# Patient Record
Sex: Female | Born: 1960 | Race: White | Hispanic: No | Marital: Single | Smoking: Former smoker
Health system: Southern US, Community
[De-identification: ages and names within clinical notes are randomized; demographics above are authoritative.]

## PROBLEM LIST (undated history)

## (undated) DIAGNOSIS — U071 COVID-19: Secondary | ICD-10-CM

## (undated) HISTORY — PX: DILATION AND CURETTAGE OF UTERUS: SHX78

## (undated) HISTORY — PX: LAPAROSCOPIC ENDOMETRIOSIS FULGURATION: SUR769

## (undated) HISTORY — PX: ABDOMINAL HYSTERECTOMY: SHX81

---

## 1998-04-14 ENCOUNTER — Other Ambulatory Visit: Admission: RE | Admit: 1998-04-14 | Discharge: 1998-04-14 | Payer: Self-pay | Admitting: Obstetrics and Gynecology

## 1998-04-22 ENCOUNTER — Ambulatory Visit (HOSPITAL_COMMUNITY): Admission: RE | Admit: 1998-04-22 | Discharge: 1998-04-22 | Payer: Self-pay | Admitting: Obstetrics and Gynecology

## 1999-07-10 ENCOUNTER — Other Ambulatory Visit: Admission: RE | Admit: 1999-07-10 | Discharge: 1999-07-10 | Payer: Self-pay | Admitting: Obstetrics and Gynecology

## 1999-07-28 ENCOUNTER — Ambulatory Visit (HOSPITAL_COMMUNITY): Admission: RE | Admit: 1999-07-28 | Discharge: 1999-07-28 | Payer: Self-pay | Admitting: Obstetrics and Gynecology

## 1999-08-07 ENCOUNTER — Encounter (INDEPENDENT_AMBULATORY_CARE_PROVIDER_SITE_OTHER): Payer: Self-pay | Admitting: Specialist

## 1999-08-07 ENCOUNTER — Other Ambulatory Visit: Admission: RE | Admit: 1999-08-07 | Discharge: 1999-08-07 | Payer: Self-pay | Admitting: Obstetrics and Gynecology

## 1999-10-06 ENCOUNTER — Other Ambulatory Visit: Admission: RE | Admit: 1999-10-06 | Discharge: 1999-10-06 | Payer: Self-pay | Admitting: *Deleted

## 2000-02-26 ENCOUNTER — Other Ambulatory Visit: Admission: RE | Admit: 2000-02-26 | Discharge: 2000-02-26 | Payer: Self-pay | Admitting: *Deleted

## 2008-09-15 ENCOUNTER — Encounter: Admission: RE | Admit: 2008-09-15 | Discharge: 2008-09-15 | Payer: Self-pay | Admitting: Unknown Physician Specialty

## 2009-03-21 ENCOUNTER — Ambulatory Visit: Payer: Self-pay | Admitting: Family Medicine

## 2009-03-27 ENCOUNTER — Ambulatory Visit: Payer: Self-pay | Admitting: Family Medicine

## 2009-03-27 DIAGNOSIS — Z862 Personal history of diseases of the blood and blood-forming organs and certain disorders involving the immune mechanism: Secondary | ICD-10-CM | POA: Insufficient documentation

## 2009-03-27 LAB — CONVERTED CEMR LAB
Ketones, urine, test strip: NEGATIVE
Nitrite: POSITIVE
Protein, U semiquant: 100
Urobilinogen, UA: 1
pH: 5

## 2009-03-28 ENCOUNTER — Encounter: Payer: Self-pay | Admitting: Family Medicine

## 2009-03-28 LAB — CONVERTED CEMR LAB
Basophils Absolute: 0.1 10*3/uL (ref 0.0–0.1)
Eosinophils Relative: 3 % (ref 0–5)
HCT: 28 % — ABNORMAL LOW (ref 36.0–46.0)
Lymphocytes Relative: 12 % (ref 12–46)
Lymphs Abs: 1.3 10*3/uL (ref 0.7–4.0)
Neutro Abs: 8 10*3/uL — ABNORMAL HIGH (ref 1.7–7.7)
Neutrophils Relative %: 76 % (ref 43–77)
Platelets: 442 10*3/uL — ABNORMAL HIGH (ref 150–400)
RDW: 15 % (ref 11.5–15.5)
WBC: 10.6 10*3/uL — ABNORMAL HIGH (ref 4.0–10.5)

## 2009-05-18 ENCOUNTER — Ambulatory Visit: Payer: Self-pay | Admitting: Family Medicine

## 2009-05-18 DIAGNOSIS — B358 Other dermatophytoses: Secondary | ICD-10-CM | POA: Insufficient documentation

## 2009-05-18 LAB — CONVERTED CEMR LAB
Bilirubin Urine: NEGATIVE
Glucose, Urine, Semiquant: NEGATIVE
Ketones, urine, test strip: NEGATIVE
Nitrite: NEGATIVE
Specific Gravity, Urine: 1.02
Urobilinogen, UA: 0.2
pH: 6

## 2009-06-25 IMAGING — CT CT ABDOMEN W/ CM
2 of 5 series · 16 of 46 positions shown, 18 images · IV contrast (READICAT/WATER & OMNI 300/[ID])
Comparison: None available.

CT ABDOMEN

CLINICAL DATA: Mass right lower quadrant.  History endometriosis.
Status post removal of left ovary.

CT ABDOMEN AND PELVIS WITH CONTRAST
TECHNIQUE: Multidetector CT imaging of the abdomen and pelvis was
performed using the standard protocol following bolus
administration of intravenous contrast.
Contrast: 100 ml Omnipaque 300

[Series 2: abdomen w/ · axial · 0.87mm/px · z∈[-463,-58]mm · 13 of 91 slices shown, 15 images]
[im 5/91  soft-tissue]
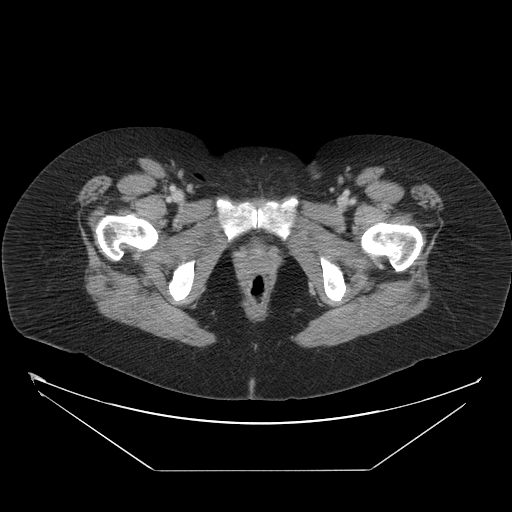
[im 5/91  bone]
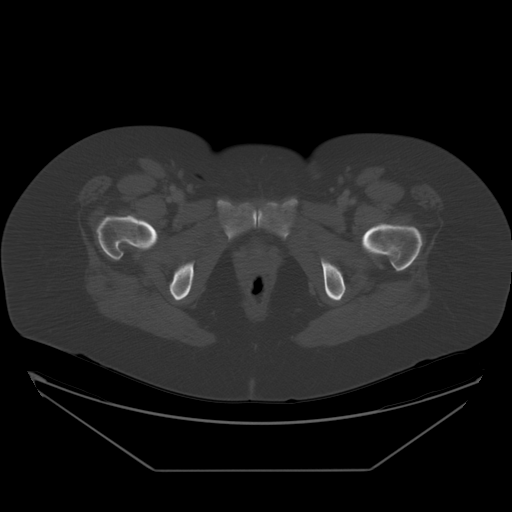
[im 15/91  soft-tissue]
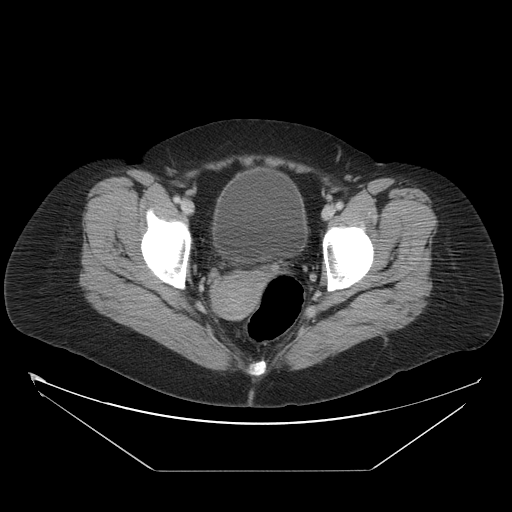
[im 19/91  soft-tissue]
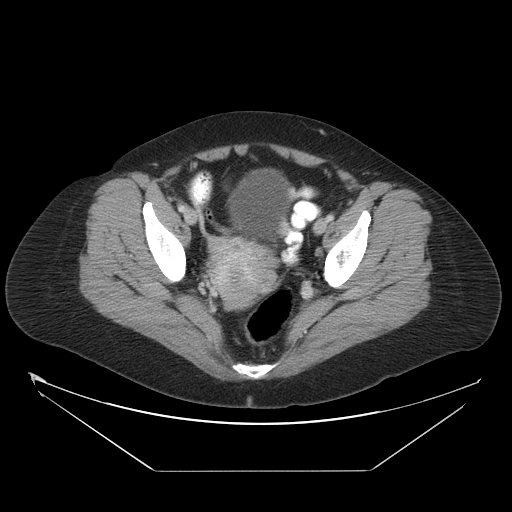
[im 24/91  soft-tissue]
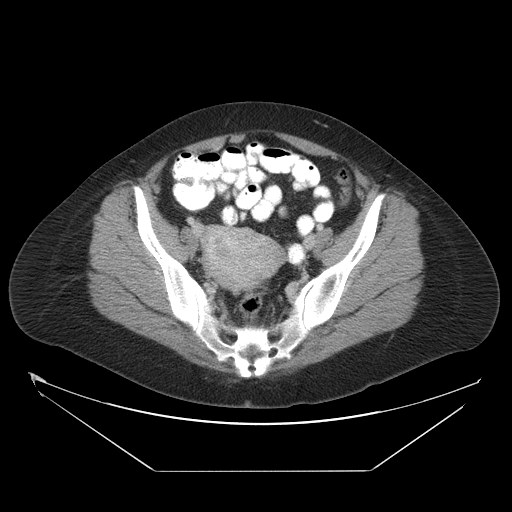
[im 34/91  soft-tissue]
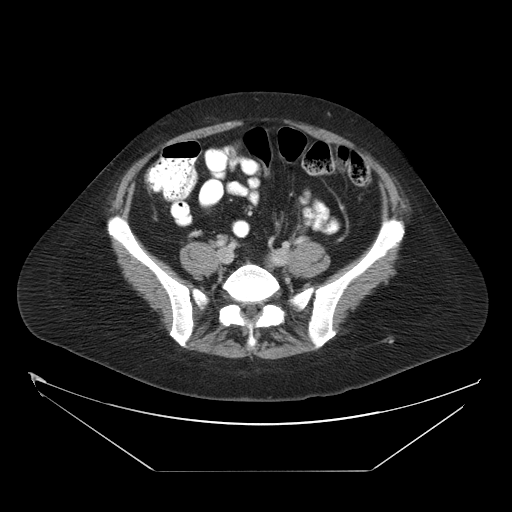
[im 38/91  soft-tissue]
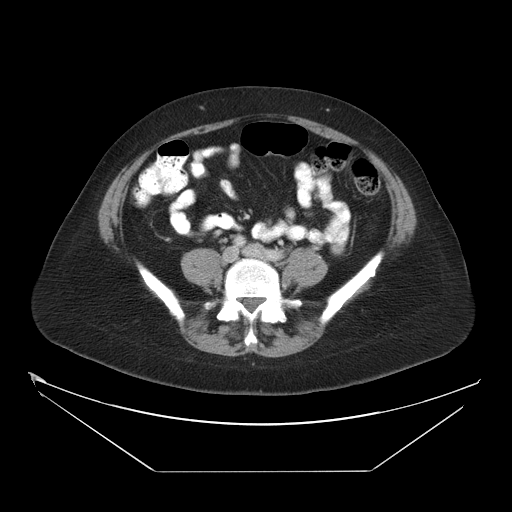
[im 48/91  soft-tissue]
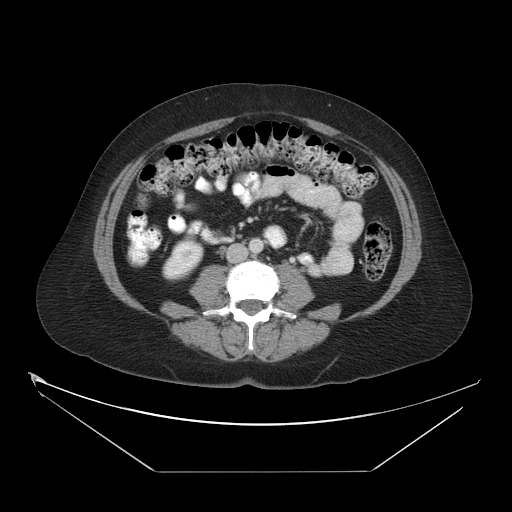
[im 53/91  soft-tissue]
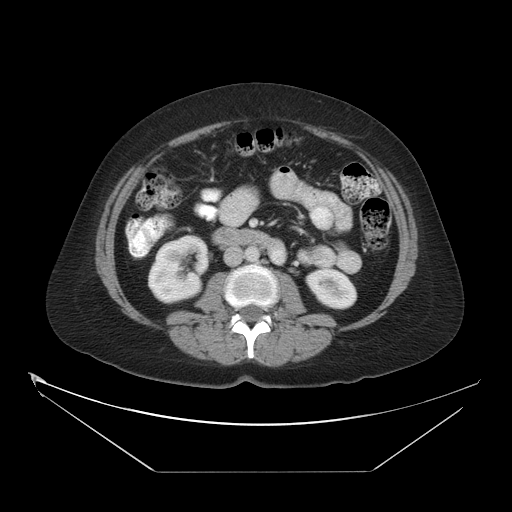
[im 57/91  soft-tissue]
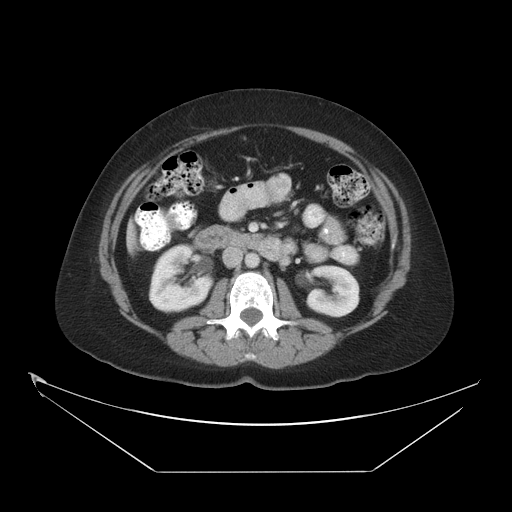
[im 57/91  bone]
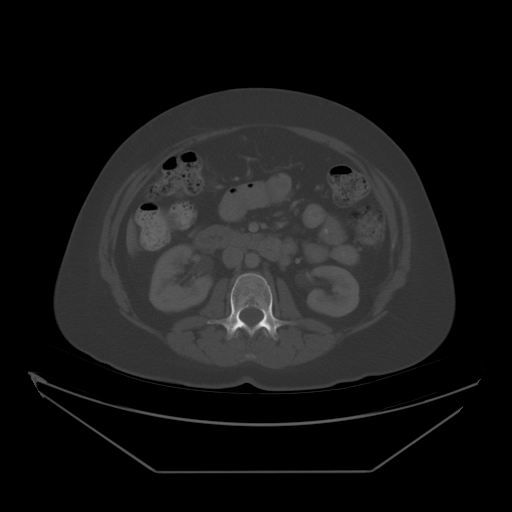
[im 67/91  soft-tissue]
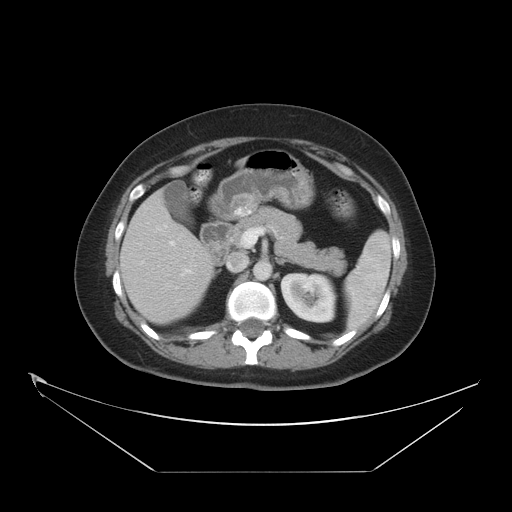
[im 72/91  soft-tissue]
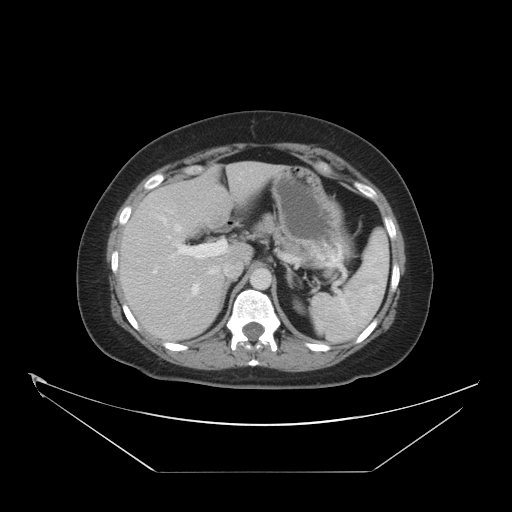
[im 76/91  soft-tissue]
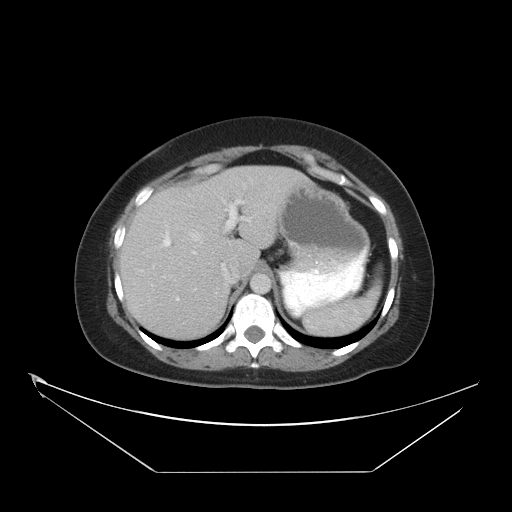
[im 86/91  soft-tissue]
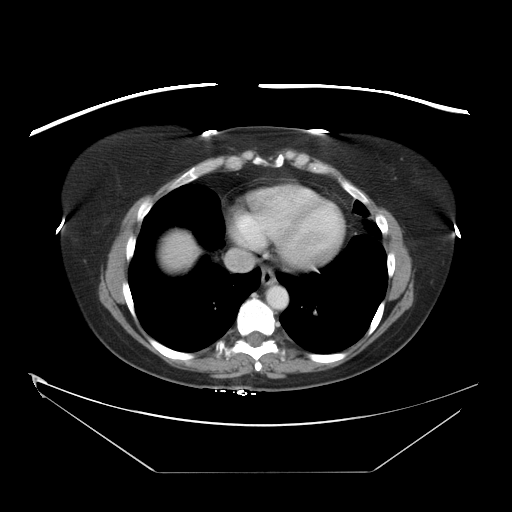

[Series 401: cor · coronal · 0.93mm/px · 3 of 116 slices shown]
[im 39/116  soft-tissue]
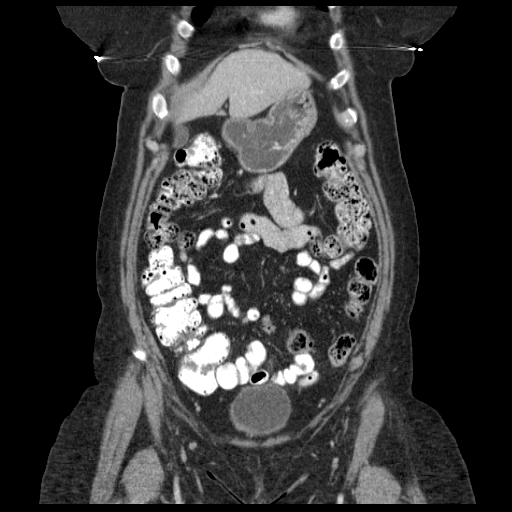
[im 52/116  soft-tissue]
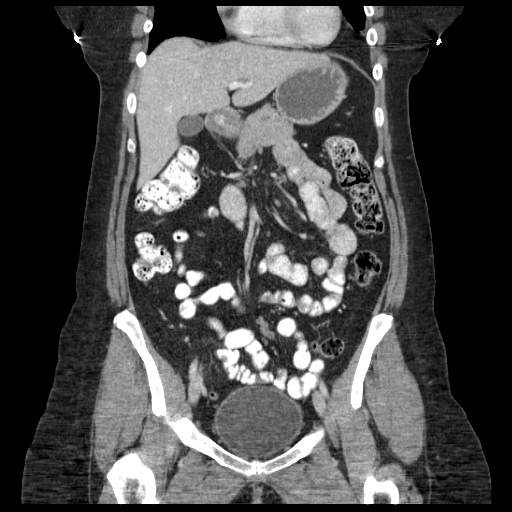
[im 64/116  soft-tissue]
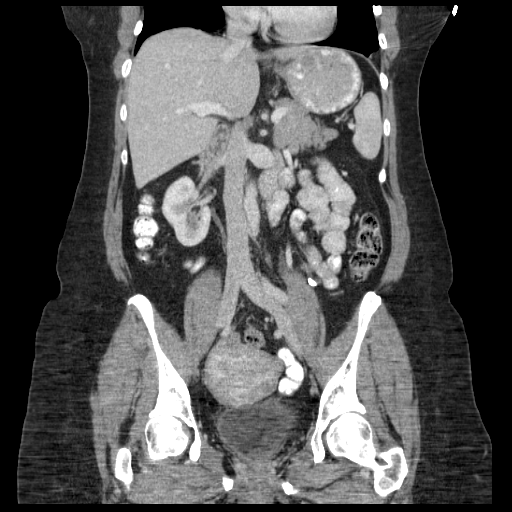

[16 of 46 positions shown; findings below may reference images not displayed]

FINDINGS: The lung bases are clear without focal nodule, mass, or
airspace disease.  Heart size is normal.  There is no significant
pleural or pericardial effusion.  The liver and spleen are normal.
Pancreas, stomach, and common bile duct are within normal limits.
There are tiny stones layering at the neck of the gallbladder.
There is no inflammatory change to suggest cholecystitis.  The
adrenal glands are within normal limits bilaterally.  The kidneys
are unremarkable.  There is no significant abdominal
lymphadenopathy or free fluid.  Bone windows are unremarkable.
IMPRESSION: 1.  Probable gallstones without evidence for cholecystitis.
2.  No acute abnormality of the abdomen.

CT PELVIS
FINDINGS: There is a soft tissue mass at the base of the uterus.
This likely represents a cervical mass.  It is conceivable this is
in the fornix of the vagina.  The uterine fundus is heterogeneous
suggesting fibroids.  The right ovary is unremarkable.  Left ovary
is surgically absent.  The urinary bladder is normal.  No the
rectosigmoid colon is within normal limits.  The remainder the
colon is unremarkable.  The appendix is visualized and normal.
There is no significant pelvic lymphadenopathy or free fluid.
IMPRESSION: 1.  Soft tissue mass at the level of the uterine cervix measures
3.6 x 4.6 x 3.6 cm.  The differential diagnosis includes cervical
cancer. This is extraperitoneal and would be a typical for
endometriosis.
2.  No focal extension or evidence for pelvic lymphadenopathy.
3.  Probable uterine fibroids.

## 2009-11-13 ENCOUNTER — Ambulatory Visit: Payer: Self-pay | Admitting: Family Medicine

## 2009-11-13 LAB — CONVERTED CEMR LAB
Ketones, urine, test strip: NEGATIVE
Specific Gravity, Urine: 1.02
Urobilinogen, UA: 0.2
pH: 5

## 2010-04-25 ENCOUNTER — Ambulatory Visit: Payer: Self-pay | Admitting: Emergency Medicine

## 2010-04-25 LAB — CONVERTED CEMR LAB: Specific Gravity, Urine: 1.015

## 2010-04-26 ENCOUNTER — Encounter: Payer: Self-pay | Admitting: Emergency Medicine

## 2010-06-02 ENCOUNTER — Ambulatory Visit: Payer: Self-pay | Admitting: Emergency Medicine

## 2010-06-03 ENCOUNTER — Encounter: Payer: Self-pay | Admitting: Emergency Medicine

## 2010-07-17 ENCOUNTER — Ambulatory Visit: Payer: Self-pay | Admitting: Emergency Medicine

## 2010-07-26 ENCOUNTER — Telehealth (INDEPENDENT_AMBULATORY_CARE_PROVIDER_SITE_OTHER): Payer: Self-pay

## 2010-09-18 ENCOUNTER — Ambulatory Visit
Admission: RE | Admit: 2010-09-18 | Discharge: 2010-09-18 | Payer: Self-pay | Source: Home / Self Care | Admitting: Family Medicine

## 2010-09-19 ENCOUNTER — Encounter: Payer: Self-pay | Admitting: Family Medicine

## 2010-09-21 ENCOUNTER — Telehealth (INDEPENDENT_AMBULATORY_CARE_PROVIDER_SITE_OTHER): Payer: Self-pay | Admitting: *Deleted

## 2010-09-21 NOTE — Assessment & Plan Note (Signed)
Summary: POSSIBLE UTI? Room 4   Vital Signs:  Patient Profile:   50 Years Old Female CC:      Dysuria x 3 days Height:     63 inches Weight:      187 pounds O2 Sat:      97 % O2 treatment:    Room Air Temp:     97.6 degrees F oral Pulse rate:   98 / minute Pulse rhythm:   regular Resp:     14 per minute BP sitting:   121 / 81  (left arm) Cuff size:   large  Vitals Entered By: Emilio Math (April 25, 2010 2:51 PM)                  Current Allergies (reviewed today): ! SULFAHistory of Present Illness Chief Complaint: Dysuria x 3 days History of Present Illness: Dysuria, frequency, mild R lumbar pain for 3 days.  Last UTI 1 year ago.  No urgency, fever, chills, hematuria, vaginal d/c.  Has not tried any OTC meds.  Allergic to sulfa.  Pain is burning and intermittant.  REVIEW OF SYSTEMS Constitutional Symptoms      Denies fever, chills, night sweats, weight loss, weight gain, and fatigue.  Eyes       Denies change in vision, eye pain, eye discharge, glasses, contact lenses, and eye surgery. Ear/Nose/Throat/Mouth       Denies hearing loss/aids, change in hearing, ear pain, ear discharge, dizziness, frequent runny nose, frequent nose bleeds, sinus problems, sore throat, hoarseness, and tooth pain or bleeding.  Respiratory       Denies dry cough, productive cough, wheezing, shortness of breath, asthma, bronchitis, and emphysema/COPD.  Cardiovascular       Denies murmurs, chest pain, and tires easily with exhertion.    Gastrointestinal       Denies stomach pain, nausea/vomiting, diarrhea, constipation, blood in bowel movements, and indigestion. Genitourniary       Complains of painful urination.      Denies kidney stones and loss of urinary control. Neurological       Denies paralysis, seizures, and fainting/blackouts. Musculoskeletal       Denies muscle pain, joint pain, joint stiffness, decreased range of motion, redness, swelling, muscle weakness, and gout.  Skin     Denies bruising, unusual mles/lumps or sores, and hair/skin or nail changes.  Psych       Denies mood changes, temper/anger issues, anxiety/stress, speech problems, depression, and sleep problems.  Past History:  Past Medical History: Reviewed history from 03/21/2009 and no changes required. fibroids  Past Surgical History: Reviewed history from 03/21/2009 and no changes required. GYN surgery Oophorectomy  Family History: Reviewed history from 03/21/2009 and no changes required. Mother, Healthy Father D, Jeanne Ivan Sister' Healthy  Social History: Reviewed history from 03/21/2009 and no changes required. Non-smoker No Etoh No drugs Data processing Physical Exam General appearance: well developed, well nourished, no acute distress Chest/Lungs: no rales, wheezes, or rhonchi bilateral, breath sounds equal without effort Heart: regular rate and  rhythm, no murmur Back: no tenderness over musculature, straight leg raises negative bilaterally, deep tendon reflexes 2+ at achilles and patella Skin: no obvious rashes or lesions MSE: oriented to time, place, and person Assessment New Problems: URINARY TRACT INFECTION (ICD-599.0)   Plan New Medications/Changes: PYRIDIUM 200 MG TABS (PHENAZOPYRIDINE HCL) 1 tab by mouth three times a day for 2 days  #6 x 0, 04/25/2010, Hoyt Koch MD CIPROFLOXACIN HCL 250 MG TABS (CIPROFLOXACIN HCL) 1  tab by mouth two times a day for 7 days  #14 x 0, 04/25/2010, Hoyt Koch MD  New Orders: Est. Patient Level II [56213] UA Dipstick w/o Micro (automated)  [81003] T-Culture, Urine [[id number]] Planning Comments:   Hydration (water, cranberry) Take meds as directed Wipe front to back If problems persist, Follow-up with your primary care physician  Urine culture is sent to the lab   The patient and/or caregiver has been counseled thoroughly with regard to medications prescribed including dosage, schedule, interactions, rationale  for use, and possible side effects and they verbalize understanding.  Diagnoses and expected course of recovery discussed and will return if not improved as expected or if the condition worsens. Patient and/or caregiver verbalized understanding.  Prescriptions: PYRIDIUM 200 MG TABS (PHENAZOPYRIDINE HCL) 1 tab by mouth three times a day for 2 days  #6 x 0   Entered and Authorized by:   Hoyt Koch MD   Signed by:   Hoyt Koch MD on 04/25/2010   Method used:   Printed then faxed to ...       [facility] [phone]* (retail)       [address] [place], [place]  [address]       Ph: [phone]       Fax: [phone]   RxID:   [id number] CIPROFLOXACIN HCL 250 MG TABS (CIPROFLOXACIN HCL) 1 tab by mouth two times a day for 7 days  #14 x 0   Entered and Authorized by:   Hoyt Koch MD   Signed by:   Hoyt Koch MD on 04/25/2010   Method used:   Printed then faxed to ...       [facility] [phone]* (retail)       [address] [place], [place]  [address]       Ph: [phone]       Fax: [phone]   RxID:   [id number]   Orders Added: 1)  Est. Patient Level II [16073] 2)  UA Dipstick w/o Micro (automated)  [81003] 3)  T-Culture, Urine [[id number]]  Laboratory Results   Urine Tests  Date/Time Received: April 25, 2010 3:03 PM  Date/Time Reported: April 25, 2010 3:03 PM   Routine Urinalysis   Color: yellow Appearance: Cloudy Glucose: negative   (Normal Range: Negative) Bilirubin: negative   (Normal Range: Negative) Ketone: trace (5)   (Normal Range: Negative) Spec. Gravity: 1.015   (Normal Range: 1.003-1.035) Blood: 2+   (Normal Range: Negative) pH: 5.5   (Normal Range: 5.0-8.0) Protein: trace   (Normal Range: Negative) Urobilinogen: 0.2   (Normal Range: 0-1) Nitrite: positive   (Normal Range: Negative) Leukocyte Esterace: 1+   (Normal Range: Negative)

## 2010-09-21 NOTE — Assessment & Plan Note (Signed)
Summary: UPPER BACK PAIN DUE TO A FALL room3   Vital Signs:  Patient Profile:   50 Years Old Female CC:      Rt side rib pain from fall this week end, rt ankle pain, rt knee pain Height:     63 inches Weight:      185 pounds O2 Sat:      98 % O2 treatment:    Room Air Temp:     97.8 degrees F oral Pulse rate:   70 / minute Pulse rhythm:   regular Resp:     16 per minute BP standing:   119 / 84  (left arm) Cuff size:   large  Vitals Entered By: Emilio Math (July 17, 2010 2:37 PM)                  Current Allergies (reviewed today): ! SULFA ! CODEINEHistory of Present Illness Chief Complaint: Rt side rib pain from fall this weekend History of Present Illness: Two day ago, a dog was running and ran into her, knocking her to the ground.  On the way down, she reports hitting her back on her truck.  Had fairly immediate pain.  Localized R lower posterior back.  Hurts to cough, sneeze, and take deep breath.  No history of back pain or trauma.  No B/B dysfx, saddle anethesia.  The L side is not painful.  It doesn't seem to be getting any better the last 2 days.   When leaving the room, she also mentioned that she has R knee pain from the same fall.  Mildly swollen, no history of knee pain or arthritis.  Mild pain with no radiation.  She has been walking normally.  REVIEW OF SYSTEMS Constitutional Symptoms      Denies fever, chills, night sweats, weight loss, weight gain, and fatigue.  Eyes       Denies change in vision, eye pain, eye discharge, glasses, contact lenses, and eye surgery. Ear/Nose/Throat/Mouth       Denies hearing loss/aids, change in hearing, ear pain, ear discharge, dizziness, frequent runny nose, frequent nose bleeds, sinus problems, sore throat, hoarseness, and tooth pain or bleeding.  Respiratory       Denies dry cough, productive cough, wheezing, shortness of breath, asthma, bronchitis, and emphysema/COPD.  Cardiovascular       Denies murmurs, chest pain,  and tires easily with exhertion.    Gastrointestinal       Denies stomach pain, nausea/vomiting, diarrhea, constipation, blood in bowel movements, and indigestion. Genitourniary       Denies painful urination, kidney stones, and loss of urinary control. Neurological       Denies paralysis, seizures, and fainting/blackouts. Musculoskeletal       Complains of muscle pain, joint pain, joint stiffness, decreased range of motion, and swelling.      Denies redness, muscle weakness, and gout.  Skin       Denies bruising, unusual mles/lumps or sores, and hair/skin or nail changes.  Psych       Denies mood changes, temper/anger issues, anxiety/stress, speech problems, depression, and sleep problems.  Past History:  Past Medical History: Reviewed history from 03/21/2009 and no changes required. fibroids  Past Surgical History: Reviewed history from 03/21/2009 and no changes required. GYN surgery Oophorectomy  Social History: Reviewed history from 03/21/2009 and no changes required. Non-smoker No Etoh No drugs Data processing Physical Exam General appearance: well developed, well nourished, mild-mod distress with movements (transferring from sitting to standing)  Head: normocephalic, atraumatic Chest/Lungs: no rales, wheezes, or rhonchi bilateral, breath sounds equal without effort Heart: regular rate and  rhythm, no murmur Extremities: R knee: medial joint line tenderness, Lachman normal, extremes of ext/flex somewhat painful, varus/valgus normal, no effusion, no ecchymoses Neurological: grossly intact and non-focal Back: No central bony tenderness.  Generalized TTP right posterior back and into axillary midline approx along 9th-11th ribs Skin: no obvious rashes or lesions.  NO bruising. MSE: oriented to time, place, and person Assessment New Problems: KNEE PAIN, RIGHT (ICD-719.46) RIB PAIN, RIGHT SIDED (ICD-786.50)  Xray is normal.  Pain likely from rib contusion.   Knee pain  from possible twisting med meniscal injury, Xray is deferred unless not getting better  Patient Education: Patient and/or caregiver instructed in the following: rest, fluids.  Plan New Medications/Changes: ULTRACET 37.5-325 MG TABS (TRAMADOL-ACETAMINOPHEN) 1 tab by mouth Q6 hrs as needed for pain  #20 x 0, 07/17/2010, Hoyt Koch MD VICODIN 5-500 MG TABS (HYDROCODONE-ACETAMINOPHEN) 1 tab by mouth at bedtime as needed for pain  #7 x 0, 07/17/2010, Hoyt Koch MD  New Orders: Est. Patient Level III [40981] T-DG Ribs Unilateral w/Chest*R* [71101] Planning Comments:   Pain meds (Vicodin only at night, Ultracet during the day).  Rest, hydration, ice.  A rib belt is tried but she doesn't like it. Refer to PT to work on her R knee.  Ice, rest, elevation.  If not improving after a few weeks, refer to sports medicine.   The patient and/or caregiver has been counseled thoroughly with regard to medications prescribed including dosage, schedule, interactions, rationale for use, and possible side effects and they verbalize understanding.  Diagnoses and expected course of recovery discussed and will return if not improved as expected or if the condition worsens. Patient and/or caregiver verbalized understanding.  Prescriptions: ULTRACET 37.5-325 MG TABS (TRAMADOL-ACETAMINOPHEN) 1 tab by mouth Q6 hrs as needed for pain  #20 x 0   Entered and Authorized by:   Hoyt Koch MD   Signed by:   Hoyt Koch MD on 07/17/2010   Method used:   Print then Give to Patient   RxID:   [id number] VICODIN 5-500 MG TABS (HYDROCODONE-ACETAMINOPHEN) 1 tab by mouth at bedtime as needed for pain  #7 x 0   Entered and Authorized by:   Hoyt Koch MD   Signed by:   Hoyt Koch MD on 07/17/2010   Method used:   Print then Give to Patient   RxID:   [id number]   Orders Added: 1)  Est. Patient Level III [44010] 2)  T-DG Ribs Unilateral w/Chest*R* [71101]

## 2010-09-21 NOTE — Progress Notes (Signed)
Summary: Courtesy Call  Phone Note Outgoing Call   Call placed by: Areta Haber CMA,  July 26, 2010 11:01 AM Summary of Call: Courtesy call to pt - Per pt, she's still in some pain but will contact her PCP or [place] if she feels she needs PT. Initial call taken by: Areta Haber CMA,  July 26, 2010 11:02 AM

## 2010-09-21 NOTE — Assessment & Plan Note (Signed)
Summary: POSSIBLE UTI?   Vital Signs:  Patient Profile:   50 Years Old Female CC:      Dysuria x 5 days Height:     63 inches Weight:      183 pounds O2 Sat:      98 % O2 treatment:    Room Air Temp:     98.6 degrees F oral Pulse rate:   88 / minute Pulse rhythm:   regular Resp:     16 per minute BP sitting:   137 / 88  (left arm) Cuff size:   regular  Vitals Entered By: Emilio Math (June 02, 2010 12:33 PM)                  Current Allergies (reviewed today): ! SULFAHistory of Present Illness History from: patient Chief Complaint: Dysuria x 5 days History of Present Illness: Patient complains of UTI symptoms for 5 days.  She describes the pain as burning during urination.  She has not used any OTC meds. + dysuria No frequency No urgency No hematuria No vaginal discharge No fever/chills No lower abdomenal pain No back pain No fatigue   REVIEW OF SYSTEMS Constitutional Symptoms      Denies fever, chills, night sweats, weight loss, weight gain, and fatigue.  Eyes       Denies change in vision, eye pain, eye discharge, glasses, contact lenses, and eye surgery. Ear/Nose/Throat/Mouth       Denies hearing loss/aids, change in hearing, ear pain, ear discharge, dizziness, frequent runny nose, frequent nose bleeds, sinus problems, sore throat, hoarseness, and tooth pain or bleeding.  Respiratory       Denies dry cough, productive cough, wheezing, shortness of breath, asthma, bronchitis, and emphysema/COPD.  Cardiovascular       Denies murmurs, chest pain, and tires easily with exhertion.    Gastrointestinal       Denies stomach pain, nausea/vomiting, diarrhea, constipation, blood in bowel movements, and indigestion. Genitourniary       Complains of painful urination.      Denies kidney stones and loss of urinary control. Neurological       Denies paralysis, seizures, and fainting/blackouts. Musculoskeletal       Denies muscle pain, joint pain, joint stiffness,  decreased range of motion, redness, swelling, muscle weakness, and gout.  Skin       Denies bruising, unusual mles/lumps or sores, and hair/skin or nail changes.  Psych       Denies mood changes, temper/anger issues, anxiety/stress, speech problems, depression, and sleep problems.  Past History:  Past Medical History: Reviewed history from 03/21/2009 and no changes required. fibroids  Past Surgical History: Reviewed history from 03/21/2009 and no changes required. GYN surgery Oophorectomy  Family History: Reviewed history from 03/21/2009 and no changes required. Mother, Healthy Father D, Jeanne Ivan Sister' Healthy  Social History: Reviewed history from 03/21/2009 and no changes required. Non-smoker No Etoh No drugs Data processing Physical Exam General appearance: well developed, well nourished, no acute distress Heart: regular rate and  rhythm, no murmur Abdomen: soft, non-tender without obvious organomegaly Back: no CVAT Skin: no obvious rashes or lesions MSE: oriented to time, place, and person Assessment New Problems: URINARY TRACT INFECTION (ICD-599.0)   Plan New Medications/Changes: PYRIDIUM 200 MG TABS (PHENAZOPYRIDINE HCL) 1 tab by mouth three times a day as needed for burning  #6 x 0, 06/02/2010, Hoyt Koch MD CIPROFLOXACIN HCL 250 MG TABS (CIPROFLOXACIN HCL) 1 tab by mouth two times a day for 7  days  #14 x 0, 06/02/2010, Hoyt Koch MD  New Orders: Est. Patient Level III [id number] UA Dipstick w/o Micro (automated)  [81003] T-Culture, Urine [[id number]] Planning Comments:   Hydration (water, cranberry) Take meds as directed Wipe front to back.  Increase in UTI's recently likely due to hormonal changes after TAHBSO Urine culture is sent to the lab Since this is the 4th UTI documented by [place] in the last 14 months, referral to urology is appropriate   The patient and/or caregiver has been counseled thoroughly with regard to medications  prescribed including dosage, schedule, interactions, rationale for use, and possible side effects and they verbalize understanding.  Diagnoses and expected course of recovery discussed and will return if not improved as expected or if the condition worsens. Patient and/or caregiver verbalized understanding.  Prescriptions: PYRIDIUM 200 MG TABS (PHENAZOPYRIDINE HCL) 1 tab by mouth three times a day as needed for burning  #6 x 0   Entered and Authorized by:   Hoyt Koch MD   Signed by:   Hoyt Koch MD on 06/02/2010   Method used:   Print then Give to Patient   RxID:   [id number] CIPROFLOXACIN HCL 250 MG TABS (CIPROFLOXACIN HCL) 1 tab by mouth two times a day for 7 days  #14 x 0   Entered and Authorized by:   Hoyt Koch MD   Signed by:   Hoyt Koch MD on 06/02/2010   Method used:   Print then Give to Patient   RxID:   [id number]   Orders Added: 1)  Est. Patient Level III [40102] 2)  UA Dipstick w/o Micro (automated)  [81003] 3)  T-Culture, Urine [[id number]]  Appended Document: POSSIBLE UTI? UA GLU: Neg BIL: NEGKET: NEG SG: .=1.030 BLO:1+ pH:5.0 PRO: Neg URO: 0.2 NIT: Neg LEU: neg

## 2010-09-21 NOTE — Assessment & Plan Note (Signed)
Summary: Frequent, painful urination x 1 dy rm 3   Vital Signs:  Patient Profile:   50 Years Old Female CC:      Frequent, painful urination x  1 dy Height:     63 inches Weight:      198 pounds O2 Sat:      98 % O2 treatment:    Room Air Temp:     97.8 degrees F oral Pulse rate:   74 / minute Pulse rhythm:   regular Resp:     16 per minute BP sitting:   125 / 84  (right arm) Cuff size:   regular  Vitals Entered By: Areta Haber CMA (November 13, 2009 1:25 PM)                  Prior Medication List:  * PYRIDIUM 200 MG TABS (PHENAZOPYRIDINE HCL) 1 by mouth 3x a day * NIZORALCREAM apply to area 2 x aday for 7-14 days   Current Allergies (reviewed today): ! SULFA      History of Present Illness Chief Complaint: Frequent, painful urination x  1 dy History of Present Illness: Patient started off w/painfukl urination yesterday. HX of previou UTI's but the last one was in August. No frequency or discharge.   Current Problems: URINARY TRACT INFECTION (ICD-599.0) DERMATOPHYTOSIS OF OTHER SPECIFIED SITES (ICD-110.8) ANEMIA, HX OF (ICD-V12.3) ACUTE CYSTITIS (ICD-595.0) EPIGASTRIC PAIN (ICD-789.06)   Current Meds MACROBID 100 MG CAPS (NITROFURANTOIN MONOHYD MACRO) sig 1 by mouth by mouth twice aday PYRIDIUM 200 MG TABS (PHENAZOPYRIDINE HCL) sig 1 by mouth 3x aday  REVIEW OF SYSTEMS Constitutional Symptoms      Denies fever, chills, night sweats, weight loss, weight gain, and fatigue.  Eyes       Denies change in vision, eye pain, eye discharge, glasses, contact lenses, and eye surgery. Ear/Nose/Throat/Mouth       Denies hearing loss/aids, change in hearing, ear pain, ear discharge, dizziness, frequent runny nose, frequent nose bleeds, sinus problems, sore throat, hoarseness, and tooth pain or bleeding.  Respiratory       Denies dry cough, productive cough, wheezing, shortness of breath, asthma, bronchitis, and emphysema/COPD.  Cardiovascular       Denies murmurs,  chest pain, and tires easily with exhertion.    Gastrointestinal       Denies stomach pain, nausea/vomiting, diarrhea, constipation, blood in bowel movements, and indigestion. Genitourniary       Complains of painful urination.      Denies kidney stones and loss of urinary control.      Comments: Frequent x 1 dy Neurological       Denies paralysis, seizures, and fainting/blackouts. Musculoskeletal       Denies muscle pain, joint pain, joint stiffness, decreased range of motion, redness, swelling, muscle weakness, and gout.  Skin       Denies bruising, unusual mles/lumps or sores, and hair/skin or nail changes.  Psych       Denies mood changes, temper/anger issues, anxiety/stress, speech problems, depression, and sleep problems.  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 Mother, Healthy Father D, Jeanne Ivan Sister' Healthy  Social History: Reviewed history from 03/21/2009 and no changes required. 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  Plan New Medications/Changes: PYRIDIUM 200 MG TABS (PHENAZOPYRIDINE HCL) sig 1 by mouth 3x aday  #15 x 0,  11/13/2009, Hassan Rowan MD MACROBID 100 MG CAPS (NITROFURANTOIN MONOHYD MACRO) sig 1 by mouth by mouth twice aday  #14 x 0, 11/13/2009, Hassan Rowan MD  New Orders: Est. Patient Level III [19147] UA Dipstick w/o Micro (manual) [81002] Planning Comments:   see below  Follow Up: Follow up on an as needed basis, Follow up with Primary Physician  The patient and/or caregiver has been counseled thoroughly with regard to medications prescribed including dosage, schedule, interactions, rationale for use, and possible side effects and they verbalize understanding.  Diagnoses and expected course of recovery discussed and will return if not improved as expected or if the condition worsens. Patient and/or caregiver verbalized understanding.  Prescriptions: PYRIDIUM 200 MG TABS (PHENAZOPYRIDINE HCL) sig 1 by mouth 3x aday  #15 x 0   Entered and Authorized by:   Hassan Rowan MD   Signed by:   Hassan Rowan MD on 11/13/2009   Method used:   Print then Give to Patient   RxID:   [id number] MACROBID 100 MG CAPS (NITROFURANTOIN MONOHYD MACRO) sig 1 by mouth by mouth twice aday  #14 x 0   Entered and Authorized by:   Hassan Rowan MD   Signed by:   Hassan Rowan MD on 11/13/2009   Method used:   Print then Give to Patient   RxID:   [id number]   Patient Instructions: 1)  Please schedule a follow-up appointment in 2 weeks. 2)  Please schedule a follow-up appointment as needed sooner if worse 3)  Please schedule an appointment with your primary doctor in :2 weeks for follow up of UTI 4)  Take your antibiotic as prescribed until ALL of it is gone, but stop if you develop a rash or swelling and contact our office as soon as possible.  Laboratory Results   Urine Tests  Date/Time Received: November 13, 2009 1:51 PM  Date/Time Reported: November 13, 2009 1:51 PM   Routine Urinalysis   Color: yellow Appearance: Hazy Glucose: negative   (Normal Range: Negative) Bilirubin: negative   (Normal Range:  Negative) Ketone: negative   (Normal Range: Negative) Spec. Gravity: 1.020   (Normal Range: 1.003-1.035) Blood: moderate   (Normal Range: Negative) pH: 5.0   (Normal Range: 5.0-8.0) Protein: trace   (Normal Range: Negative) Urobilinogen: 0.2   (Normal Range: 0-1) Nitrite: negative   (Normal Range: Negative) Leukocyte Esterace: large   (Normal Range: Negative)

## 2010-09-27 NOTE — Assessment & Plan Note (Signed)
Summary: POSS UTI/WSE Room 5   Vital Signs:  Patient Profile:   50 Years Old Female CC:      Dysuria x 2 days Height:     63 inches Weight:      185 pounds O2 Sat:      97 % O2 treatment:    Room Air Temp:     97 degrees F oral Pulse rate:   75 / minute Pulse rhythm:   regular Resp:     16 per minute BP sitting:   115 / 81  (left arm) Cuff size:   large  Vitals Entered By: Emilio Math (September 18, 2010 10:32 AM)                  Current Allergies (reviewed today): ! SULFA ! CODEINEHistory of Present Illness Chief Complaint: Dysuria x 2 days History of Present Illness:  Subjective:  Patient presents complaining of UTI symptoms for 2 days.  Complains of dysuria, frequency, and urgency.  No hematuria or nocturia.  No abnormal vaginal discharge.  No fever/chills/sweats.  No abdominal pain.  No flank pain.  No nausea/vomiting.  History of hysterectomy.  REVIEW OF SYSTEMS Constitutional Symptoms      Denies fever, chills, night sweats, weight loss, weight gain, and fatigue.  Eyes       Denies change in vision, eye pain, eye discharge, glasses, contact lenses, and eye surgery. Ear/Nose/Throat/Mouth       Denies hearing loss/aids, change in hearing, ear pain, ear discharge, dizziness, frequent runny nose, frequent nose bleeds, sinus problems, sore throat, hoarseness, and tooth pain or bleeding.  Respiratory       Denies dry cough, productive cough, wheezing, shortness of breath, asthma, bronchitis, and emphysema/COPD.  Cardiovascular       Denies murmurs, chest pain, and tires easily with exhertion.    Gastrointestinal       Denies stomach pain, nausea/vomiting, diarrhea, constipation, blood in bowel movements, and indigestion. Genitourniary       Complains of painful urination.      Denies kidney stones and loss of urinary control. Neurological       Denies paralysis, seizures, and fainting/blackouts. Musculoskeletal       Denies muscle pain, joint pain, joint  stiffness, decreased range of motion, redness, swelling, muscle weakness, and gout.  Skin       Denies bruising, unusual mles/lumps or sores, and hair/skin or nail changes.  Psych       Denies mood changes, temper/anger issues, anxiety/stress, speech problems, depression, and sleep problems.  Past History:  Past Medical History: Reviewed history from 03/21/2009 and no changes required. fibroids  Past Surgical History: Reviewed history from 03/21/2009 and no changes required. GYN surgery Oophorectomy  Family History: Reviewed history from 03/21/2009 and no changes required. Mother, Healthy Father D, Jeanne Ivan Sister' Healthy  Social History: Reviewed history from 03/21/2009 and no changes required. 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  Extraocular movement is intact.  Conjunctivae are not inflamed.  Mouth:  moist mucous membranes  Neck:  Supple.  No adenopathy is present.   Lungs:  Clear to auscultation.  Breath sounds are equal.  Heart:  Regular rate and rhythm without murmurs, rubs, or gallops.  Abdomen:  Nontender without masses or hepatosplenomegaly.  Bowel sounds are present.  No CVA or flank tenderness.  Skin:  No rash  urinalysis (dipstick):  3+ leuks; + nit Assessment New Problems: ACUTE CYSTITIS (ICD-595.0)   Plan New Medications/Changes: CEPHALEXIN 500 MG CAPS (CEPHALEXIN) One by mouth two times a day  #14 x 0, 09/18/2010, Donna Christen MD  New Orders: Urinalysis [CPT-81003] T-Culture, Urine [[id number]] Est. Patient Level III [62130] Planning Comments:   Urine culture pending. Begin Keflex.  Increase fluids. Return for worsening symptoms.  Follow-up with PCP if not improving.   The patient and/or caregiver has been counseled thoroughly with regard to medications prescribed including dosage, schedule, interactions, rationale for use, and  possible side effects and they verbalize understanding.  Diagnoses and expected course of recovery discussed and will return if not improved as expected or if the condition worsens. Patient and/or caregiver verbalized understanding.  Prescriptions: CEPHALEXIN 500 MG CAPS (CEPHALEXIN) One by mouth two times a day  #14 x 0   Entered and Authorized by:   Donna Christen MD   Signed by:   Donna Christen MD on 09/18/2010   Method used:   Print then Give to Patient   RxID:   [id number]   Orders Added: 1)  Urinalysis [CPT-81003] 2)  T-Culture, Urine [[id number]] 3)  Est. Patient Level III [53664]  Appended Document: POSS UTI/WSE Room 5 UA:  GLU: Neg BIL: Neg KET: Neg SG: 1.015 BLO: 2+ pH:5.5 PRO:,Neg URO: 0.2 NIT: Pos LEU: 3+

## 2010-09-27 NOTE — Progress Notes (Signed)
  Phone Note Outgoing Call   Call placed by: Clemens Catholic LPN,  September 21, 2010 11:33 AM Call placed to: Patient Summary of Call: call back: notified pt of urine culture results. advised her to complete ABT and call back if she has any questions or concerns. Initial call taken by: Clemens Catholic LPN,  September 21, 2010 11:35 AM

## 2011-01-05 NOTE — Op Note (Signed)
[facility]  Patient:    Christine Gilbert                     MRN: [id number] Proc. Date: 07/28/99 Adm. Date:  [id number] Attending:  Frederich Balding                           Operative Report  PREOPERATIVE DIAGNOSIS:       Pelvic endometriosis.  POSTOPERATIVE DIAGNOSIS:      Pelvic endometriosis.  Adhesive process. Left-sided endometrioma.  OPERATION:                    Open laparoscopy with lysis of adhesions, laser ablation of endometriotic implants, left ovarian cystotomy with ablation of endometrioma.  SURGEON:                      Juluis Mire, M.D.  ASSISTANT:  ANESTHESIA:                   General endotracheal anesthesia.  ESTIMATED BLOOD LOSS:         Minimal.  PACKS AND DRAINS:             None.  BLOOD REPLACED:               None.  COMPLICATIONS:                None.  INDICATIONS:                  Dictated in the history and physical.  DESCRIPTION OF PROCEDURE:     The patient was taken to the OR and placed in the  supine position.  After a satisfactory level of general endotracheal anesthesia was obtained, the patient was placed in the dorsal lithotomy position using the Allen stirrups.  The abdomen, perineum, and vagina were prepped with Betadine and draped as a sterile field.  The bladder was emptied by in-and-out catheterization. Examination under anesthesia revealed the uterus to be enlarged, adnexa unremarkable.  Hulka tenaculum was put in place and secured.  The patient was draped out for laparoscopy.  A subumbilical incision was made with the knife. he incision was extended through the subcutaneous tissue.  The fascia was identified and entered sharply.  Incision to the fascia was extended laterally.  Two lateral sutures of 0 Vicryl were put in place and held.  The peritoneum was identified nd entered sharply.  Hasson cannula was put in place and secured with the fascial sutures.  The abdomen was  inflated with carbon dioxide.  The operating laparoscope was then introduced.  There was no evidence of injury to adjacent organs.  A 5 m trocar was put in place under direct visualization.  The appendix was visualized and noted to be normal.  Upper abdomen including liver and tip of the gallbladder were unremarkable.  The uterus was enlarged consistent with adenomyosis.  She had numerous implants of endometriosis along the bladder area, the left and right pelvic side wall.  She had a left-sided endometrioma.  There was adhesions between the sigmoid colon and the left fundal area of the uterus.  These were taken down using the scissors.  There was no evidence of injury to the colon.  We used the  bipolar for hemostasis.  We then went about using the YAG laser with the rounded saphire tip to ablate the areas of  endometriosis along the bladder flap, left pelvic side wall, and right pelvic side wall.  We also did a left ovarian cystotomy draining the left-sided endometrioma and we ablated the cyst lining using the YAG laser with the saphire tip.  At the end of the procedure, all areas of active endometriosis had been ablated.  All adhesions had been taken down.  There were  other adhesions also involving both left and right ovary to pelvic side wall. These were flimsy and again taken down.  There was no active bleeding, no evidence of injury to adjacent organs.  At this point in time, the abdomen was deflated f its carbon dioxide.  All trocars were removed.  The subumbilical fascia closed ith figure-of-eight suture of 0 Vicryl, the skin was closed with a running subcuticular of 4-0 Vicryl.  The suprapubic incision was closed with Steri-Strips.  The Hulka tenaculum was then removed.  Sponge, needle, and instrument counts were correct by circulating nurse x 2.  The patient tolerated the procedure well and was returned to the recovery room in good condition. DD:  07/28/99 TD:   07/30/99 Job: [id number]

## 2011-04-26 IMAGING — CR DG RIBS W/ CHEST 3+V*R*
3 series · 3 of 3 positions shown · non-contrast
Comparison: None.

CLINICAL DATA: Right posterior chest wall pain following fall

RIGHT RIBS AND CHEST - 3+ VIEW

[view not recorded (1 of 3)]
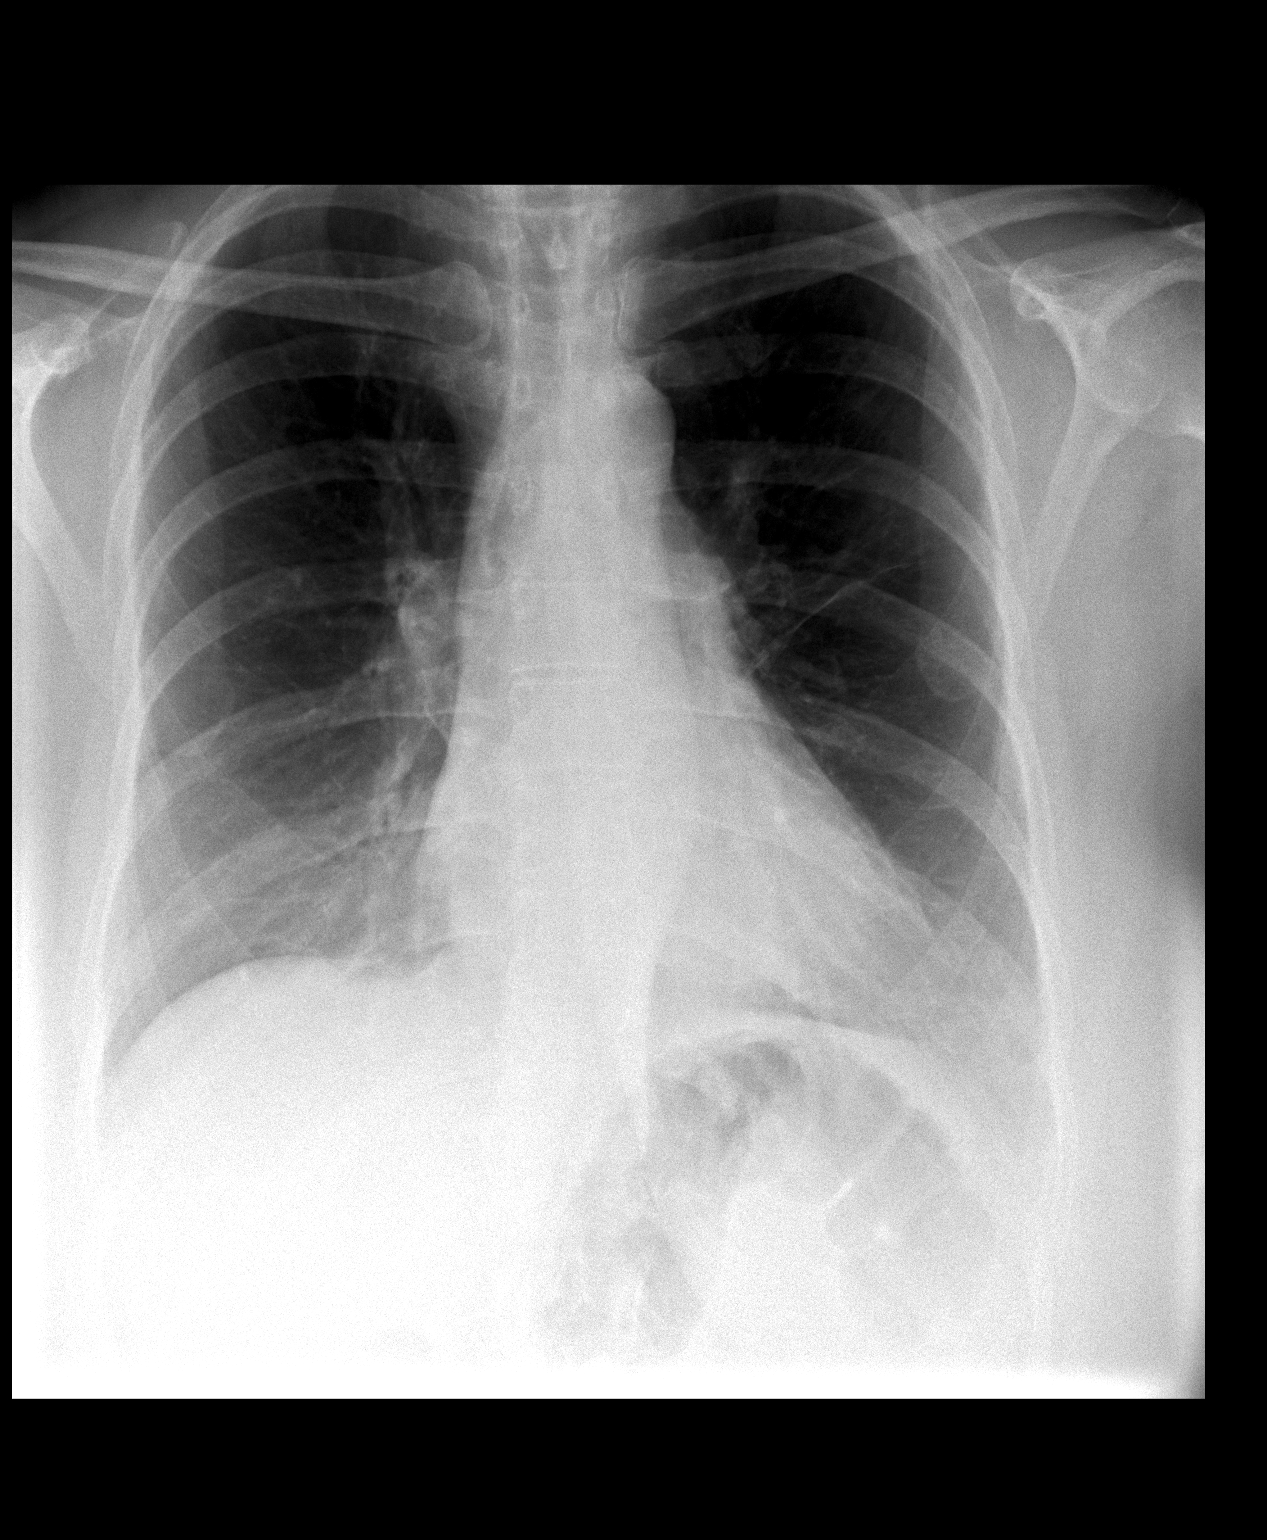

[view not recorded (2 of 3)]
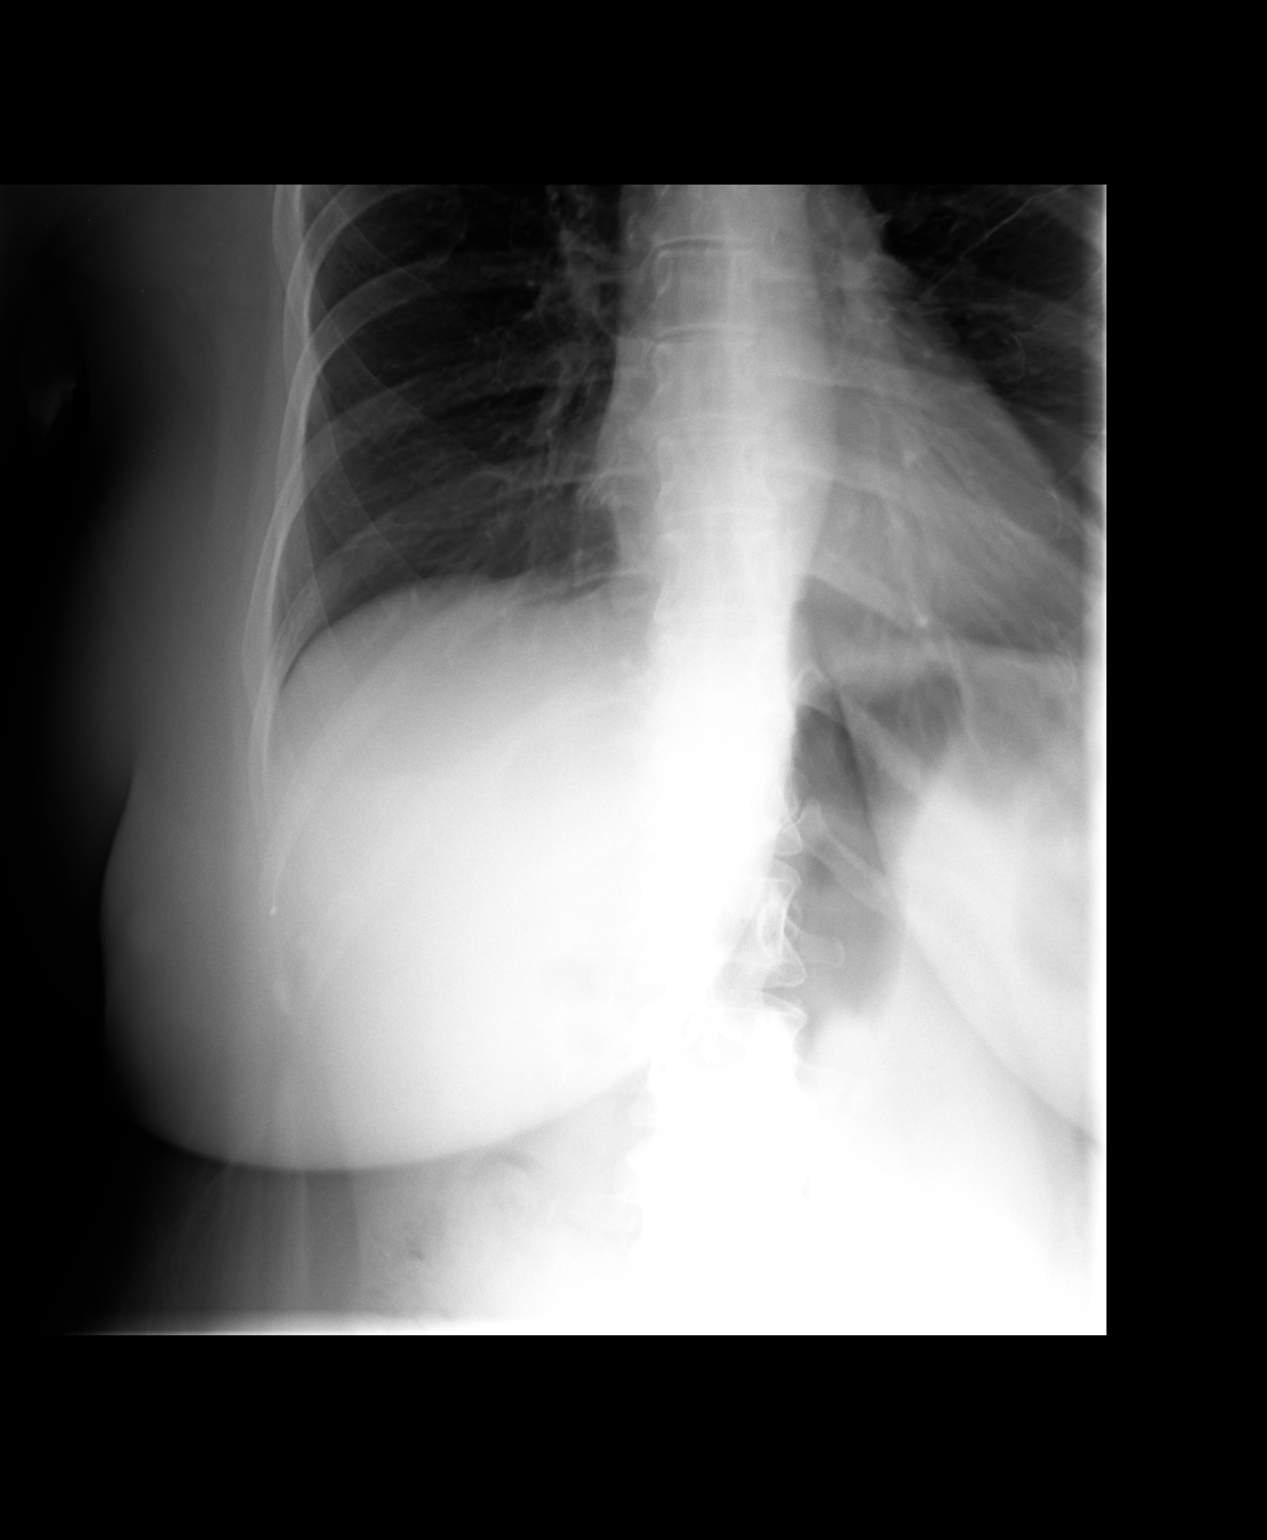

[view not recorded (3 of 3)]
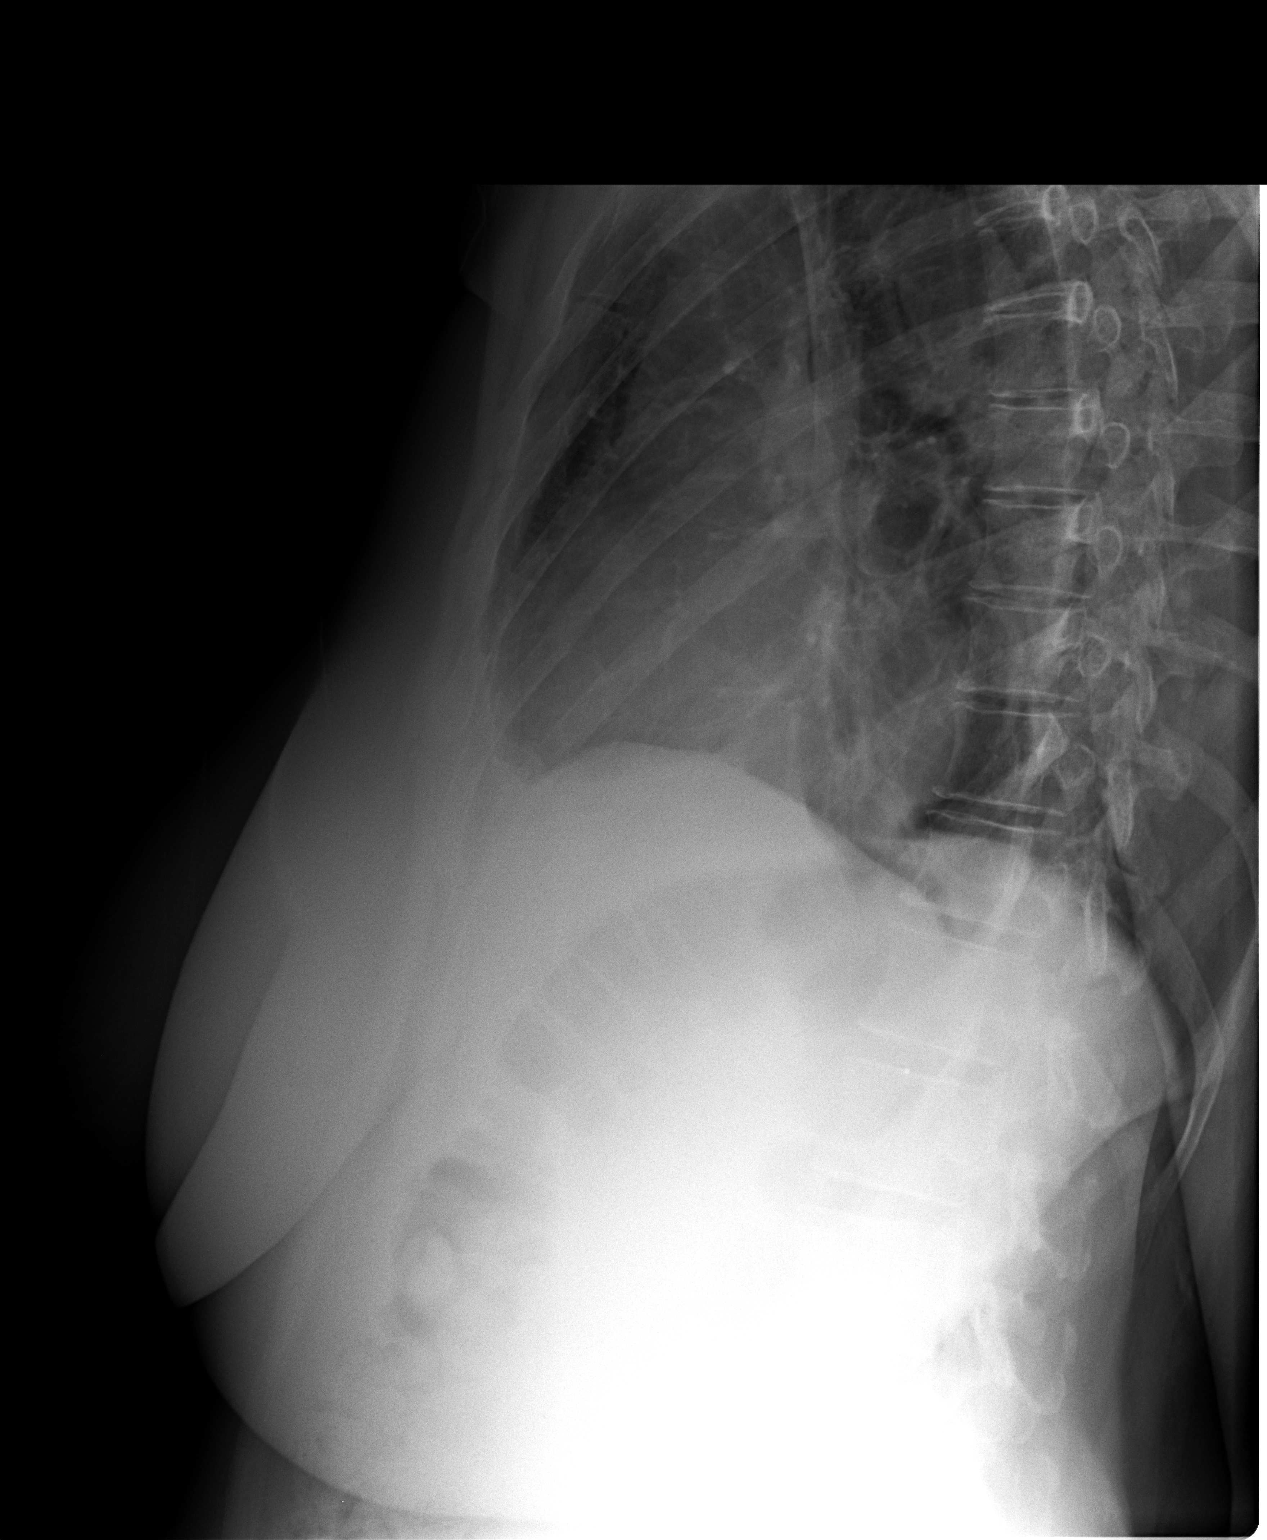

[3 of 3 positions shown; findings below may reference images not displayed]

FINDINGS: Heart and lungs normal.  No rib fractures or lesions.
No pneumothorax or hemothorax.
IMPRESSION: No acute or significant findings.

## 2011-06-25 ENCOUNTER — Encounter: Payer: Self-pay | Admitting: *Deleted

## 2011-06-25 ENCOUNTER — Emergency Department
Admission: EM | Admit: 2011-06-25 | Discharge: 2011-06-25 | Disposition: A | Discharge status: Home / Self Care | Payer: BC Managed Care – PPO | Source: Home / Self Care | Attending: Family Medicine | Admitting: Family Medicine

## 2011-06-25 DIAGNOSIS — R300 Dysuria: Secondary | ICD-10-CM

## 2011-06-25 DIAGNOSIS — N3000 Acute cystitis without hematuria: Secondary | ICD-10-CM

## 2011-06-25 LAB — POCT URINALYSIS DIPSTICK
Bilirubin, UA: NEGATIVE
Ketones, UA: NEGATIVE
pH, UA: 6 (ref 5–8)

## 2011-06-25 MED ORDER — PHENAZOPYRIDINE HCL 200 MG PO TABS: 200.0000 mg | ORAL_TABLET | Freq: Three times a day (TID) | ORAL | Status: AC

## 2011-06-25 MED ORDER — NITROFURANTOIN MONOHYD MACRO 100 MG PO CAPS: 100.0000 mg | ORAL_CAPSULE | Freq: Two times a day (BID) | ORAL | Status: AC

## 2011-06-25 NOTE — ED Provider Notes (Signed)
History     CSN: [id number] Arrival date & time: 06/25/2011  2:51 PM   First MD Initiated Contact with Patient 06/25/11 1506      Chief Complaint  Patient presents with  . Urinary Tract Infection    (Consider location/radiation/quality/duration/timing/severity/associated sxs/prior treatment) Patient is a 50 y.o. female presenting with urinary tract infection. The history is provided by the patient.  Urinary Tract Infection This is a new problem. The current episode started yesterday. The problem has been gradually worsening. Pertinent negatives include no abdominal pain. The symptoms are relieved by nothing. She has tried nothing for the symptoms.    Past Medical History  Diagnosis Date  . Endometriosis     Past Surgical History  Procedure Date  . Abdominal hysterectomy   . Laparoscopic endometriosis fulguration     History reviewed. No pertinent family history.  History  Substance Use Topics  . Smoking status: Never Smoker   . Smokeless tobacco: Not on file  . Alcohol Use: Yes    OB History    Grav Para Term Preterm Abortions TAB SAB Ect Mult Living                  Review of Systems  Constitutional: Negative for fever and chills.  HENT: Negative.   Respiratory: Negative.   Cardiovascular: Negative.   Gastrointestinal: Negative for nausea, vomiting, abdominal pain and abdominal distention.  Genitourinary: Positive for dysuria, urgency, frequency and difficulty urinating. Negative for hematuria, flank pain, decreased urine volume, genital sores and pelvic pain.  Musculoskeletal: Negative.   Skin: Negative for rash.    Allergies  Codeine and Sulfonamide derivatives  Home Medications   Current Outpatient Rx  Name Route Sig Dispense Refill  . NITROFURANTOIN MONOHYD MACRO 100 MG PO CAPS Oral Take 1 capsule (100 mg total) by mouth 2 (two) times daily. 14 capsule 0  . PHENAZOPYRIDINE HCL 200 MG PO TABS Oral Take 1 tablet (200 mg total) by mouth 3 (three)  times daily. 6 tablet 0    Take with food.    BP 122/75  Pulse 73  Temp(Src) 97.7 F (36.5 C) (Oral)  Resp 14  Ht 5' 3.25" (1.607 m)  Wt 197 lb (89.359 kg)  BMI 34.62 kg/m2  SpO2 98%  Physical Exam  Constitutional: She appears well-developed and well-nourished.  HENT:  Mouth/Throat: Oropharynx is clear and moist.  Eyes: Pupils are equal, round, and reactive to light.  Neck: Neck supple.  Cardiovascular: Normal rate and regular rhythm.   Pulmonary/Chest: Breath sounds normal.  Abdominal: Soft. Bowel sounds are normal. She exhibits no distension. There is no tenderness. There is no CVA tenderness.       No flank tenderness  Skin: No rash noted.    ED Course  Procedures    Labs Reviewed  POCT URINALYSIS DIPSTICK:  Small blood, positive nitrite, moderate leukocytes  URINE CULTURE pending      1. Dysuria   2. Acute cystitis       MDM   Uncomplicated cystitis.  Urine culture pending.  Begin Macrobid and Pyridium.  Increase fluid intake. Return for worsening symptoms (or follow-up with PCP).        Donna Christen, MD 06/27/11 2320

## 2011-06-28 LAB — URINE CULTURE

## 2011-06-29 ENCOUNTER — Telehealth: Payer: Self-pay | Admitting: *Deleted

## 2011-08-22 ENCOUNTER — Encounter: Payer: Self-pay | Admitting: *Deleted

## 2011-08-22 ENCOUNTER — Emergency Department
Admission: EM | Admit: 2011-08-22 | Discharge: 2011-08-22 | Disposition: A | Discharge status: Home / Self Care | Payer: BC Managed Care – PPO | Source: Home / Self Care | Attending: Family Medicine | Admitting: Family Medicine

## 2011-08-22 DIAGNOSIS — N3000 Acute cystitis without hematuria: Secondary | ICD-10-CM

## 2011-08-22 LAB — POCT URINALYSIS DIPSTICK
Bilirubin, UA: NEGATIVE
Ketones, UA: NEGATIVE
Protein, UA: NEGATIVE
Spec Grav, UA: 1.02 (ref 1.005–1.03)
pH, UA: 5.5 (ref 5–8)

## 2011-08-22 MED ORDER — CEPHALEXIN 500 MG PO CAPS: 500.0000 mg | ORAL_CAPSULE | Freq: Two times a day (BID) | ORAL | Status: AC

## 2011-08-22 NOTE — ED Notes (Signed)
Patient c/o dysuria and uriary frequency x 3 days. She has taken AZO.

## 2011-08-22 NOTE — ED Provider Notes (Signed)
History     CSN: [id number]  Arrival date & time 08/22/11  1322   First MD Initiated Contact with Patient 08/22/11 1434      Chief Complaint  Patient presents with  . Dysuria      HPI Comments: Patient reports that her previous UTI resolved.  She now has had recurrent symptoms for two days, but feels well otherwise.  No fevers, chills, and sweats.  No abdominal or back pain.  No vaginal discharge.  Patient is a 51 y.o. female presenting with dysuria. The history is provided by the patient.  Dysuria  This is a recurrent problem. The current episode started 2 days ago. The problem occurs every urination. The problem has been gradually worsening. The quality of the pain is described as burning. The pain is mild. There has been no fever. Associated symptoms include frequency, hesitancy and urgency. Pertinent negatives include no chills, no sweats, no nausea, no vomiting, no discharge, no hematuria and no flank pain. Treatments tried: Azo.    Past Medical History  Diagnosis Date  . Endometriosis     Past Surgical History  Procedure Date  . Abdominal hysterectomy   . Laparoscopic endometriosis fulguration     No family history on file.  History  Substance Use Topics  . Smoking status: Never Smoker   . Smokeless tobacco: Not on file  . Alcohol Use: Yes    OB History    Grav Para Term Preterm Abortions TAB SAB Ect Mult Living                  Review of Systems  Constitutional: Negative for chills.  Gastrointestinal: Negative for nausea and vomiting.  Genitourinary: Positive for dysuria, hesitancy, urgency and frequency. Negative for hematuria and flank pain.  All other systems reviewed and are negative.    Allergies  Codeine and Sulfonamide derivatives  Home Medications   Current Outpatient Rx  Name Route Sig Dispense Refill  . CEPHALEXIN 500 MG PO CAPS Oral Take 1 capsule (500 mg total) by mouth 2 (two) times daily. 14 capsule 0    BP 137/82  Pulse 75   Temp(Src) 97.9 F (36.6 C) (Oral)  Resp 12  Ht 5' 3.25" (1.607 m)  Wt 167 lb (75.751 kg)  BMI 29.35 kg/m2  SpO2 97%  Physical Exam Nursing notes and Vital Signs reviewed. Appearance:  Patient appears healthy, stated age, and in no acute distress Eyes:  Pupils are equal, round, and reactive to light and accomodation.  Extraocular movement is intact.  Conjunctivae are not inflamed .  Pharynx:  Normal, moist mucous membranes  Neck:  Supple.  No adenopathy Lungs:  Clear to auscultation.  Breath sounds are equal.  Heart:  Regular rate and rhythm without murmurs, rubs, or gallops.  Abdomen:  Nontender without masses or hepatosplenomegaly.  Bowel sounds are present.  No CVA or flank tenderness.       ED Course  Procedures  none   Labs Reviewed  POCT URINALYSIS DIPSTICK:  Trace blood, positive nitrite, small leuks  URINE CULTURE pending      1. Acute cystitis       MDM  Recurrent UTI. Urine culture pending. Begin Keflex for one week.   Increase fluid intake.  May continue Azo for about two days.   Followup with PCP if not improving.        Donna Christen, MD 08/22/11 1452

## 2011-08-25 ENCOUNTER — Telehealth: Payer: Self-pay | Admitting: Family Medicine

## 2011-08-25 LAB — URINE CULTURE

## 2011-09-15 ENCOUNTER — Emergency Department
Admission: EM | Admit: 2011-09-15 | Discharge: 2011-09-15 | Disposition: A | Discharge status: Home / Self Care | Payer: BC Managed Care – PPO | Source: Home / Self Care | Attending: Family Medicine | Admitting: Family Medicine

## 2011-09-15 DIAGNOSIS — N3000 Acute cystitis without hematuria: Secondary | ICD-10-CM

## 2011-09-15 LAB — POCT URINALYSIS DIPSTICK
Bilirubin, UA: NEGATIVE
Ketones, UA: NEGATIVE
Leukocytes, UA: NEGATIVE
Nitrite, UA: NEGATIVE
Protein, UA: NEGATIVE

## 2011-09-15 MED ORDER — NITROFURANTOIN MONOHYD MACRO 100 MG PO CAPS: 100.0000 mg | ORAL_CAPSULE | Freq: Two times a day (BID) | ORAL | Status: AC

## 2011-09-15 NOTE — ED Provider Notes (Signed)
History     CSN: [id number]  Arrival date & time 09/15/11  1440   First MD Initiated Contact with Patient 09/15/11 1541      Chief Complaint  Patient presents with  . Dysuria      HPI Comments: Patient complains of one week history of mild dysuria, urgency.  No fever, and does not feel ill.  She has been using OTC Cystospaz.  No abdominal or pelvic pain. She has a past history of TAH.  Patient is a 51 y.o. female presenting with dysuria. The history is provided by the patient.  Dysuria  This is a recurrent problem. Episode onset: one week ago. The problem occurs intermittently. The problem has not changed since onset.The quality of the pain is described as burning. The pain is mild. There has been no fever. Associated symptoms include frequency, hesitancy and urgency. Pertinent negatives include no chills, no sweats, no nausea, no vomiting, no discharge, no hematuria, no possible pregnancy and no flank pain. She has tried nothing for the symptoms. Her past medical history is significant for recurrent UTIs.    Past Medical History  Diagnosis Date  . Endometriosis     Past Surgical History  Procedure Date  . Abdominal hysterectomy   . Laparoscopic endometriosis fulguration     No family history on file.  History  Substance Use Topics  . Smoking status: Former Games developer  . Smokeless tobacco: Not on file  . Alcohol Use: Yes    OB History    Grav Para Term Preterm Abortions TAB SAB Ect Mult Living                  Review of Systems  Constitutional: Negative for chills.  Gastrointestinal: Negative for nausea and vomiting.  Genitourinary: Positive for dysuria, hesitancy, urgency and frequency. Negative for hematuria and flank pain.  All other systems reviewed and are negative.    Allergies  Codeine and Sulfonamide derivatives  Home Medications   Current Outpatient Rx  Name Route Sig Dispense Refill  . NITROFURANTOIN MONOHYD MACRO 100 MG PO CAPS Oral Take 1 capsule  (100 mg total) by mouth 2 (two) times daily. 14 capsule 0    BP 122/86  Pulse 69  Temp(Src) 97.6 F (36.4 C) (Oral)  Resp 18  Ht 5\' 3"  (1.6 m)  Wt 194 lb 4 oz (88.111 kg)  BMI 34.41 kg/m2  SpO2 97%  Physical Exam Nursing notes and Vital Signs reviewed. Appearance:  Patient appears healthy, stated age, and in no acute distress Eyes:  Pupils are equal, round, and reactive to light and accomodation.  Extraocular movement is intact.  Conjunctivae are not inflamed    Pharynx:  Normal; moist mucous membranes  Neck:  Supple.  No adenopathy Lungs:  Clear to auscultation.  Breath sounds are equal.  Heart:  Regular rate and rhythm without murmurs, rubs, or gallops.  Abdomen:  Nontender without masses or hepatosplenomegaly.  Bowel sounds are present.  No CVA or flank tenderness.  Extremities:  No edema.  No calf tenderness Skin:  No rash present.   ED Course  Procedures none   Labs Reviewed  POCT URINALYSIS DIPSTICK trace blood  URINE CULTURE pending      1. Acute cystitis       MDM  Urine culture pending. Begin Macrobid for one week. Increase fluid intake.  Return for worsening symptoms (pain, fever, vomiting, etc.) Recommend follow-up with PCP (consider vaginal estrogen cream, etc.)  Donna Christen, MD 09/15/11 [phone]

## 2011-09-15 NOTE — ED Notes (Signed)
Painful urination x 1 week.

## 2011-09-16 LAB — URINE CULTURE: Colony Count: NO GROWTH

## 2011-09-18 ENCOUNTER — Telehealth: Payer: Self-pay | Admitting: *Deleted

## 2011-10-04 ENCOUNTER — Encounter: Payer: Self-pay | Admitting: Emergency Medicine

## 2011-10-04 ENCOUNTER — Emergency Department
Admission: EM | Admit: 2011-10-04 | Discharge: 2011-10-04 | Disposition: A | Discharge status: Home / Self Care | Payer: BC Managed Care – PPO | Source: Home / Self Care | Attending: Family Medicine | Admitting: Family Medicine

## 2011-10-04 DIAGNOSIS — R220 Localized swelling, mass and lump, head: Secondary | ICD-10-CM

## 2011-10-04 LAB — POCT RAPID STREP A (OFFICE): Rapid Strep A Screen: NEGATIVE

## 2011-10-04 NOTE — ED Notes (Signed)
Sore throat.

## 2011-10-04 NOTE — ED Notes (Addendum)
Bumps on back of tongue noticed last night concerned that it is strep

## 2011-10-04 NOTE — ED Provider Notes (Signed)
History     CSN: [id number]  Arrival date & time 10/04/11  1437   None     Chief Complaint  Patient presents with  . Sore Throat     HPI Comments: Patient reports that she noticed bumps on the very back of her tongue today, and does not recall seeing these before.  There is no pain or soreness.  She has no difficulty swallowing.  No fevers, chills, and sweats.  No sinus congestion.  She feels well otherwise.  The history is provided by the patient.    Past Medical History  Diagnosis Date  . Endometriosis     Past Surgical History  Procedure Date  . Abdominal hysterectomy   . Laparoscopic endometriosis fulguration     History reviewed. No pertinent family history.  History  Substance Use Topics  . Smoking status: Former Games developer  . Smokeless tobacco: Not on file  . Alcohol Use: Yes    OB History    Grav Para Term Preterm Abortions TAB SAB Ect Mult Living                  Review of Systems  Constitutional: Negative.   HENT: Negative.   Eyes: Negative.   Respiratory: Negative.   Cardiovascular: Negative.   Gastrointestinal: Negative.   Genitourinary: Negative.   Musculoskeletal: Negative.   Skin: Negative.   Neurological: Negative for headaches.    Allergies  Codeine and Sulfonamide derivatives  Home Medications  No current outpatient prescriptions on file.  BP 141/91  Pulse 73  Temp(Src) 97.5 F (36.4 C) (Oral)  Resp 16  Ht 5\' 3"  (1.6 m)  Wt 192 lb (87.091 kg)  BMI 34.01 kg/m2  SpO2 98%  Physical Exam Nursing notes and Vital Signs reviewed. Appearance:  Patient appears stated age, and in no acute distress Eyes:  Pupils are equal, round, and reactive to light and accomodation.  Extraocular movement is intact.  Conjunctivae are not inflamed  Ears:  Canals normal.  Tympanic membranes normal.  Nose:  Mildly congested turbinates.  No sinus tenderness.   Mouth:  No lesions noted.  Normal tongue. Pharynx:  Normal Neck:  Supple.   No adenopathy    Lungs:  Clear to auscultation.  Breath sounds are equal.  Heart:  Regular rate and rhythm without murmurs, rubs, or gallops.  Abdomen:  Nontender without masses or hepatosplenomegaly.  Bowel sounds are present.  No CVA or flank tenderness.  Skin:  No rash present.   ED Course  Procedures  none   Labs Reviewed  POCT RAPID STREP A (OFFICE) negative      1. Nodule of tongue       MDM  No abnormalities noted; normal tongue, mouth, and pharynx. Patient reassured that the bumps she has observed on posterior tongue are normal papillae.        Donna Christen, MD 10/07/11 [phone]

## 2013-05-25 ENCOUNTER — Emergency Department
Admission: EM | Admit: 2013-05-25 | Discharge: 2013-05-25 | Disposition: A | Discharge status: Home / Self Care | Payer: BC Managed Care – PPO | Source: Home / Self Care | Attending: Family Medicine | Admitting: Family Medicine

## 2013-05-25 DIAGNOSIS — N390 Urinary tract infection, site not specified: Secondary | ICD-10-CM

## 2013-05-25 LAB — POCT URINALYSIS DIP (MANUAL ENTRY)
Glucose, UA: NEGATIVE
Ketones, POC UA: NEGATIVE
Spec Grav, UA: >=1.03 (ref 1.005–1.03)
Urobilinogen, UA: 0.2 (ref 0–1)

## 2013-05-25 MED ORDER — CEPHALEXIN 500 MG PO CAPS: 500.0000 mg | ORAL_CAPSULE | Freq: Three times a day (TID) | ORAL | Status: DC

## 2013-05-25 NOTE — ED Notes (Signed)
Patient complains of frequent urination for a few days.

## 2013-05-25 NOTE — ED Provider Notes (Signed)
CSN: [id number]     Arrival date & time 05/25/13  1851 History   First MD Initiated Contact with Patient 05/25/13 1855     Chief Complaint  Patient presents with  . Urinary Frequency    x few days   HPI  DYSURIA Onset:  2 weeks  Description: increased urinary frequency, urgency  Modifying factors: none  Symptoms Urgency:  yes Frequency: yes  Hesitancy:  yes Hematuria:  no Flank Pain:  no Fever: no Nausea/Vomiting:  no Missed LMP: no STD exposure: no Discharge: no Irritants: no Rash: no  Red Flags   More than 3 UTI's last 12 months:  no PMH of  Diabetes or Immunosuppression:  no Renal Disease/Calculi: no Urinary Tract Abnormality:  no Instrumentation or Trauma: no    Past Medical History  Diagnosis Date  . Endometriosis    Past Surgical History  Procedure Laterality Date  . Abdominal hysterectomy    . Laparoscopic endometriosis fulguration     History reviewed. No pertinent family history. History  Substance Use Topics  . Smoking status: Former Games developer  . Smokeless tobacco: Not on file  . Alcohol Use: Yes   OB History   Grav Para Term Preterm Abortions TAB SAB Ect Mult Living                 Review of Systems  All other systems reviewed and are negative.    Allergies  Codeine and Sulfonamide derivatives  Home Medications   Current Outpatient Rx  Name  Route  Sig  Dispense  Refill  . cephALEXin (KEFLEX) 500 MG capsule   Oral   Take 1 capsule (500 mg total) by mouth 3 (three) times daily.   21 capsule   0    BP 121/85  Pulse 76  Temp(Src) 98 F (36.7 C) (Oral)  Ht 5' 0.5" (1.537 m)  Wt 195 lb (88.451 kg)  BMI 37.44 kg/m2  SpO2 98% Physical Exam  Constitutional: She appears well-developed and well-nourished.  HENT:  Head: Normocephalic and atraumatic.  Eyes: Conjunctivae are normal. Pupils are equal, round, and reactive to light.  Neck: Normal range of motion.  Cardiovascular: Normal rate, regular rhythm and normal heart sounds.    Pulmonary/Chest: Effort normal.  Abdominal: Soft. Bowel sounds are normal.  No flank pain No suprapubic tenderness   Musculoskeletal: Normal range of motion.  Neurological: She is alert.  Skin: Skin is warm.    ED Course  Procedures (including critical care time) Labs Review Labs Reviewed - No data to display Imaging Review No results found.  MDM   1. UTI (urinary tract infection)    Will treat with keflex Urine culture. Discussed infectious and GU red flags.  Follow up as needed.     The patient and/or caregiver has been counseled thoroughly with regard to treatment plan and/or medications prescribed including dosage, schedule, interactions, rationale for use, and possible side effects and they verbalize understanding. Diagnoses and expected course of recovery discussed and will return if not improved as expected or if the condition worsens. Patient and/or caregiver verbalized understanding.         Doree Albee, MD 05/25/13 [phone]

## 2013-05-27 LAB — URINE CULTURE: Colony Count: NO GROWTH

## 2013-05-28 ENCOUNTER — Telehealth: Payer: Self-pay | Admitting: *Deleted

## 2013-07-27 ENCOUNTER — Encounter: Payer: Self-pay | Admitting: Emergency Medicine

## 2013-07-27 ENCOUNTER — Emergency Department
Admission: EM | Admit: 2013-07-27 | Discharge: 2013-07-27 | Disposition: A | Discharge status: Home / Self Care | Payer: BC Managed Care – PPO | Source: Home / Self Care | Attending: Family Medicine | Admitting: Family Medicine

## 2013-07-27 DIAGNOSIS — R300 Dysuria: Secondary | ICD-10-CM

## 2013-07-27 DIAGNOSIS — N3000 Acute cystitis without hematuria: Secondary | ICD-10-CM

## 2013-07-27 LAB — POCT URINALYSIS DIP (MANUAL ENTRY)
Bilirubin, UA: NEGATIVE
Ketones, POC UA: NEGATIVE
Protein Ur, POC: NEGATIVE
Spec Grav, UA: 1.025 (ref 1.005–1.03)

## 2013-07-27 MED ORDER — NITROFURANTOIN MONOHYD MACRO 100 MG PO CAPS: 100.0000 mg | ORAL_CAPSULE | Freq: Two times a day (BID) | ORAL | Status: DC

## 2013-07-27 NOTE — ED Provider Notes (Signed)
CSN: [id number]     Arrival date & time 07/27/13  1731 History   First MD Initiated Contact with Patient 07/27/13 1809     Chief Complaint  Patient presents with  . Urinary Tract Infection      HPI Comments: Christine Gilbert complains of dysuria and polyuria for 2 days. Denies fever/chills or flank pain  Patient is a 52 y.o. female presenting with dysuria. The history is provided by the patient.  Dysuria Pain quality:  Burning Pain severity:  Mild Onset quality:  Sudden Duration:  2 days Timing:  Constant Progression:  Unchanged Chronicity:  Recurrent Recent urinary tract infections: yes   Relieved by:  Nothing Worsened by:  Nothing tried Ineffective treatments:  None tried Urinary symptoms: frequent urination and hesitancy   Urinary symptoms: no discolored urine, no foul-smelling urine, no hematuria and no bladder incontinence   Associated symptoms: no abdominal pain, no fever, no flank pain, no genital lesions, no nausea, no vaginal discharge and no vomiting   Risk factors: recurrent urinary tract infections     Past Medical History  Diagnosis Date  . Endometriosis    Past Surgical History  Procedure Laterality Date  . Abdominal hysterectomy    . Laparoscopic endometriosis fulguration     History reviewed. No pertinent family history. History  Substance Use Topics  . Smoking status: Former Games developer  . Smokeless tobacco: Never Used  . Alcohol Use: Yes   OB History   Grav Para Term Preterm Abortions TAB SAB Ect Mult Living                 Review of Systems  Constitutional: Negative for fever.  Gastrointestinal: Negative for nausea, vomiting and abdominal pain.  Genitourinary: Positive for dysuria. Negative for flank pain and vaginal discharge.  All other systems reviewed and are negative.    Allergies  Codeine and Sulfonamide derivatives  Home Medications   Current Outpatient Rx  Name  Route  Sig  Dispense  Refill  . nitrofurantoin, macrocrystal-monohydrate,  (MACROBID) 100 MG capsule   Oral   Take 1 capsule (100 mg total) by mouth 2 (two) times daily. Take with food   14 capsule   0    BP 120/87  Pulse 73  Temp(Src) 98 F (36.7 C) (Oral)  Resp 14  Wt 193 lb (87.544 kg)  SpO2 97% Physical Exam Nursing notes and Vital Signs reviewed. Appearance:  Patient appears stated age, and in no acute distress Eyes:  Pupils are equal, round, and reactive to light and accomodation.  Extraocular movement is intact.  Conjunctivae are not inflamed  Pharynx:  Normal Neck:  Supple.  No adenopathy Lungs:  Clear to auscultation.  Breath sounds are equal.  Heart:  Regular rate and rhythm without murmurs, rubs, or gallops.  Abdomen:  Nontender without masses or hepatosplenomegaly.  Bowel sounds are present.  No CVA or flank tenderness.  Extremities:  No edema.   Skin:  No rash present.   ED Course  Procedures  none    Labs Reviewed  URINE CULTURE  POCT URINALYSIS DIP (MANUAL ENTRY):  BLO small; LEU large        MDM   1. Dysuria   2. Acute cystitis    Urine culture pending. Begin Macrobid Increase fluid intake. If symptoms become significantly worse during the night or over the weekend, proceed to the local emergency room. Followup with urologist if symptoms recur    Lattie Haw, MD 07/30/13 1204

## 2013-07-27 NOTE — ED Notes (Signed)
Christine Gilbert c/o dysuria and polyuria x 2 days. Denies fever/chills or flank pain.

## 2013-07-30 LAB — URINE CULTURE: Colony Count: 75000

## 2013-11-25 ENCOUNTER — Emergency Department (INDEPENDENT_AMBULATORY_CARE_PROVIDER_SITE_OTHER)
Admission: EM | Admit: 2013-11-25 | Discharge: 2013-11-25 | Disposition: A | Discharge status: Home / Self Care | Payer: BC Managed Care – PPO | Source: Home / Self Care | Attending: Emergency Medicine | Admitting: Emergency Medicine

## 2013-11-25 ENCOUNTER — Encounter: Payer: Self-pay | Admitting: Emergency Medicine

## 2013-11-25 DIAGNOSIS — B373 Candidiasis of vulva and vagina: Secondary | ICD-10-CM

## 2013-11-25 DIAGNOSIS — B3731 Acute candidiasis of vulva and vagina: Secondary | ICD-10-CM

## 2013-11-25 DIAGNOSIS — N898 Other specified noninflammatory disorders of vagina: Secondary | ICD-10-CM

## 2013-11-25 DIAGNOSIS — L293 Anogenital pruritus, unspecified: Secondary | ICD-10-CM

## 2013-11-25 MED ORDER — TRIAMCINOLONE ACETONIDE 0.1 % EX CREA: 1.0000 "application " | TOPICAL_CREAM | Freq: Two times a day (BID) | CUTANEOUS | Status: DC

## 2013-11-25 MED ORDER — TRIAMCINOLONE ACETONIDE 0.1 % EX CREA: TOPICAL_CREAM | CUTANEOUS | Status: DC

## 2013-11-25 MED ORDER — FLUCONAZOLE 150 MG PO TABS: 150.0000 mg | ORAL_TABLET | Freq: Every day | ORAL | Status: DC

## 2013-11-25 NOTE — ED Provider Notes (Signed)
CSN: [id number]     Arrival date & time 11/25/13  1902 History   First MD Initiated Contact with Patient 11/25/13 1913     Chief Complaint  Patient presents with  . Rash    Patient is a 53 y.o. female presenting with rash. The history is provided by the patient.  Rash Pain location: Pruritic rash just external to the vaginal areas. Pain quality comment:  Complains of itch. No pain Onset quality:  Unable to specify Duration:  1 week Timing:  Constant Progression:  Worsening Chronicity:  New Context: not recent illness, not recent sexual activity and not trauma   Relieved by:  Nothing Associated symptoms: no chest pain, no cough, no diarrhea, no dysuria, no fever, no melena, no nausea, no shortness of breath, no sore throat, no vaginal bleeding, no vaginal discharge and no vomiting    She's been trying to exercise and run more recently, so she has sweated more recently especially with warmer weather. To her knowledge,  Not exposed to any allergens.Denies any new soaps or detergents or clothing.  Past Medical History  Diagnosis Date  . Endometriosis    Past Surgical History  Procedure Laterality Date  . Abdominal hysterectomy    . Laparoscopic endometriosis fulguration     Family History  Problem Relation Age of Onset  . Emphysema Father    History  Substance Use Topics  . Smoking status: Former Games developermoker  . Smokeless tobacco: Never Used  . Alcohol Use: Yes   OB History   Grav Para Term Preterm Abortions TAB SAB Ect Mult Living                 Review of Systems  Constitutional: Negative for fever.  HENT: Negative for sore throat.   Respiratory: Negative for cough and shortness of breath.   Cardiovascular: Negative for chest pain.  Gastrointestinal: Negative for nausea, vomiting, diarrhea and melena.  Genitourinary: Negative for dysuria, vaginal bleeding and vaginal discharge.  Skin: Positive for rash.  All other systems reviewed and are negative.   Allergies   Codeine and Sulfonamide derivatives  Home Medications   Current Outpatient Rx  Name  Route  Sig  Dispense  Refill  . fluconazole (DIFLUCAN) 150 MG tablet   Oral   Take 1 tablet (150 mg total) by mouth daily. X 7 days   7 tablet   0   . nitrofurantoin, macrocrystal-monohydrate, (MACROBID) 100 MG capsule   Oral   Take 1 capsule (100 mg total) by mouth 2 (two) times daily. Take with food   14 capsule   0   . triamcinolone cream (KENALOG) 0.1 %      Apply a thin film of cream to external skin area twice a day as needed for itch.   30 g   0   . triamcinolone cream (KENALOG) 0.1 %   Topical   Apply 1 application topically 2 (two) times daily. Use a thin film of cream on the EXTERNAL skin twice a day as needed for itch.   30 g   0    BP 139/83  Pulse 76  Temp(Src) 98.2 F (36.8 C) (Oral)  Resp 18  Ht 5\' 3"  (1.6 m)  Wt 198 lb (89.812 kg)  BMI 35.08 kg/m2  SpO2 96% Physical Exam  Nursing note and vitals reviewed. Constitutional: She is oriented to person, place, and time. She appears well-developed and well-nourished. No distress.  Exam performed with Neysa Bonitohristy (nurse) as chaperone  HENT:  Head:  Normocephalic and atraumatic.  Eyes: Conjunctivae and EOM are normal. Pupils are equal, round, and reactive to light. No scleral icterus.  Neck: Normal range of motion.  Cardiovascular: Normal rate.   Pulmonary/Chest: Effort normal.  Abdominal: She exhibits no distension.  Musculoskeletal: Normal range of motion.  Neurological: She is alert and oriented to person, place, and time.  Skin: Skin is warm.  The skin around external vaginal areas are mildly red and inflamed with maculopapular rash. No pustules or vesicles or other lesions. No discharge noted. Speculum and pelvic exam declined by patient at this time.  Psychiatric: She has a normal mood and affect.    ED Course  Procedures (including critical care time) Labs Review Labs Reviewed - No data to display Imaging  Review No results found.   MDM   1. Vaginal itching   2. Monilial vulvitis    Treatment options discussed, as well as risks, benefits, alternatives. Patient voiced understanding and agreement with the following plans: Diflucan 150 mg by mouth daily x7 days. May use triamcinolone cream, applying a thin film externally twice a day when necessary itch, but precautions discussed at by [place] not to use intravaginally. May use by mouth Benadryl or Zyrtec as needed for itch. Other hygiene and nonpharmacologic measures discussed. Followup with GYN or PCP if no better within 10 days, sooner if worse or new symptoms. Precautions discussed. Red flags discussed. Questions invited and answered. Patient voiced understanding and agreement.    Lajean Manes, MD 11/25/13 2108

## 2013-11-25 NOTE — ED Notes (Signed)
Pt c/o itchy rash in her vaginal area x 1wk.

## 2014-11-09 ENCOUNTER — Encounter: Payer: Self-pay | Admitting: Obstetrics & Gynecology

## 2014-11-09 ENCOUNTER — Ambulatory Visit (INDEPENDENT_AMBULATORY_CARE_PROVIDER_SITE_OTHER): Payer: PRIVATE HEALTH INSURANCE | Admitting: Obstetrics & Gynecology

## 2014-11-09 VITALS — BP 146/92 | HR 89 | Resp 16 | Ht 63.0 in | Wt 206.0 lb

## 2014-11-09 DIAGNOSIS — Z01419 Encounter for gynecological examination (general) (routine) without abnormal findings: Secondary | ICD-10-CM | POA: Diagnosis not present

## 2014-11-09 DIAGNOSIS — R319 Hematuria, unspecified: Secondary | ICD-10-CM

## 2014-11-09 DIAGNOSIS — Z0000 Encounter for general adult medical examination without abnormal findings: Secondary | ICD-10-CM

## 2014-11-09 LAB — POCT URINALYSIS DIPSTICK
Bilirubin, UA: NEGATIVE
Glucose, UA: NEGATIVE
KETONES UA: NEGATIVE
Leukocytes, UA: NEGATIVE
Nitrite, UA: NEGATIVE
PH UA: 6.5
PROTEIN UA: NEGATIVE
SPEC GRAV UA: 1.02
UROBILINOGEN UA: NEGATIVE

## 2014-11-09 NOTE — Progress Notes (Signed)
Subjective:    Christine Gilbert is a 54 y.o. DW P2 (24 and 56 yo kids, no grands)  female who presents for an annual exam. The patient has no complaints today. But 2 weeks ago she noted blood when she wiped after voiding. It has not happened since then.  The patient is not sexually active. GYN screening history: last pap: was normal. The patient wears seatbelts: yes. The patient participates in regular exercise: yes. Has the patient ever been transfused or tattooed?: no. The patient reports that there is not domestic violence in her life.   Menstrual History: OB History    Gravida Para Term Preterm AB TAB SAB Ectopic Multiple Living   3 2 2  1 1    2       Menarche age: [age]  No LMP recorded. Patient has had a hysterectomy.    The following portions of the patient's history were reviewed and updated as appropriate: allergies, current medications, past family history, past medical history, past social history, past surgical history and problem list.  Review of Systems A comprehensive review of systems was negative.  She declines a flu vaccine. 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 Last mammo overdue. FP is Harl Bowieathy Judge MD. No recent blood work.   Objective:    BP 146/92 mmHg  Pulse 89  Resp 16  Ht 5\' 3"  (1.6 m)  Wt 206 lb (93.441 kg)  BMI 36.50 kg/m2  General Appearance:    Alert, cooperative, no distress, appears stated age  Head:    Normocephalic, without obvious abnormality, atraumatic  Eyes:    PERRL, conjunctiva/corneas clear, EOM's intact, fundi    benign, both eyes  Ears:    Normal TM's and external ear canals, both ears  Nose:   Nares normal, septum midline, mucosa normal, no drainage    or sinus tenderness  Throat:   Lips, mucosa, and tongue normal; teeth and gums normal  Neck:   Supple, symmetrical, trachea midline, no adenopathy;    thyroid:  no enlargement/tenderness/nodules; no carotid   bruit or JVD  Back:     Symmetric, no curvature, ROM normal, no CVA tenderness   Lungs:     Clear to auscultation bilaterally, respirations unlabored  Chest Wall:    No tenderness or deformity   Heart:    Regular rate and rhythm, S1 and S2 normal, no murmur, rub   or gallop  Breast Exam:    No tenderness, masses, or nipple abnormality  Abdomen:     Soft, non-tender, bowel sounds active all four quadrants,    no masses, no organomegaly  Genitalia:    Normal female without lesion, discharge or tenderness     Extremities:   Extremities normal, atraumatic, no cyanosis or edema  Pulses:   2+ and symmetric all extremities  Skin:   Skin color, texture, turgor normal, no rashes or lesions  Lymph nodes:   Cervical, supraclavicular, and axillary nodes normal  Neurologic:   CNII-XII intact, normal strength, sensation and reflexes    throughout  .    Assessment:    Healthy female exam.    Plan:     Breast self exam technique reviewed and patient encouraged to perform self-exam monthly. Mammogram.   Fasting labs at her convenience

## 2014-11-09 NOTE — Addendum Note (Signed)
Addended by: Granville LewisLARK, Frederik Standley L on: 11/09/2014 03:11 PM   Modules accepted: Orders

## 2014-11-10 LAB — URINE CULTURE
Colony Count: NO GROWTH
ORGANISM ID, BACTERIA: NO GROWTH

## 2015-01-04 ENCOUNTER — Encounter: Payer: Self-pay | Admitting: *Deleted

## 2015-01-04 ENCOUNTER — Emergency Department (INDEPENDENT_AMBULATORY_CARE_PROVIDER_SITE_OTHER)
Admission: EM | Admit: 2015-01-04 | Discharge: 2015-01-04 | Disposition: A | Discharge status: Home / Self Care | Payer: PRIVATE HEALTH INSURANCE | Source: Home / Self Care | Attending: Emergency Medicine | Admitting: Emergency Medicine

## 2015-01-04 DIAGNOSIS — J209 Acute bronchitis, unspecified: Secondary | ICD-10-CM

## 2015-01-04 DIAGNOSIS — J029 Acute pharyngitis, unspecified: Secondary | ICD-10-CM | POA: Diagnosis not present

## 2015-01-04 DIAGNOSIS — J0100 Acute maxillary sinusitis, unspecified: Secondary | ICD-10-CM

## 2015-01-04 LAB — POCT RAPID STREP A (OFFICE): Rapid Strep A Screen: NEGATIVE

## 2015-01-04 MED ORDER — AMOXICILLIN 875 MG PO TABS: ORAL_TABLET | ORAL | Status: DC

## 2015-01-04 NOTE — ED Provider Notes (Signed)
CSN: [id number]     Arrival date & time 01/04/15  1818 History   First MD Initiated Contact with Patient 01/04/15 1823     Chief Complaint  Patient presents with  . Sore Throat  . Cough   (Consider location/radiation/quality/duration/timing/severity/associated sxs/prior Treatment) HPI Started with viral URI symptoms of mild sore throat sinus congestion and cough about 12 days ago. May have had a low-grade fever. Symptoms improved on its own, but then started with worsening symptoms for past 4-5 days. Moderate to severe sore throat. Sinus congestion with discolored rhinorrhea. Cough occasionally productive of discolored sputum. Shortness of breath or wheezing She is a nonsmoker. No history of chronic lung disease. Associated symptoms: May have had a low-grade fever, but not recently documented. No chest pain or shortness of breath or wheezing  Tried Mucinex without significant improvement. Past Medical History  Diagnosis Date  . Endometriosis    Past Surgical History  Procedure Laterality Date  . Abdominal hysterectomy    . Laparoscopic endometriosis fulguration    . Dilation and curettage of uterus     Family History  Problem Relation Age of Onset  . Emphysema Father    History  Substance Use Topics  . Smoking status: Former Games developermoker  . Smokeless tobacco: Never Used  . Alcohol Use: 0.0 oz/week    0 Standard drinks or equivalent per week     Comment: rarely   OB History    Gravida Para Term Preterm AB TAB SAB Ectopic Multiple Living   3 2 2  1 1    2      Review of Systems Remainder of Review of Systems negative for acute change except as noted in the HPI.  Allergies  Codeine and Sulfonamide derivatives  Home Medications   Prior to Admission medications   Medication Sig Start Date End Date Taking? Authorizing Provider  amoxicillin (AMOXIL) 875 MG tablet Take 1 twice a day X 10 days. 01/04/15   Lajean Manesavid Massey, MD   BP 127/83 mmHg  Pulse 70  Temp(Src) 98.3 F (36.8 C)  (Oral)  Resp 16  Ht 5' 3.25" (1.607 m)  Wt 201 lb (91.173 kg)  BMI 35.30 kg/m2  SpO2 97% Physical Exam  Constitutional: She is oriented to person, place, and time. She appears well-developed and well-nourished. No distress.  HENT:  Head: Normocephalic and atraumatic.  Right Ear: Tympanic membrane, external ear and ear canal normal.  Left Ear: Tympanic membrane, external ear and ear canal normal.  Nose: Mucosal edema and rhinorrhea present. Right sinus exhibits maxillary sinus tenderness. Left sinus exhibits maxillary sinus tenderness.  Mouth/Throat: Mucous membranes are normal. No oral lesions. Posterior oropharyngeal erythema present. No oropharyngeal exudate or posterior oropharyngeal edema.  Posterior pharynx: Tonsils slightly enlarged, but very red and inflamed. Posterior pharynx red. No exudate.  Eyes: Right eye exhibits no discharge. Left eye exhibits no discharge. No scleral icterus.  Neck: Neck supple.  Minimally enlarged, shoddy anterior cervical node  Cardiovascular: Normal rate, regular rhythm and normal heart sounds.   Pulmonary/Chest: Effort normal. She has no wheezes. She has rhonchi. She has no rales.  Lymphadenopathy:    She has no cervical adenopathy.  Neurological: She is alert and oriented to person, place, and time.  Skin: Skin is warm and dry.  Psychiatric: She has a normal mood and affect.  Nursing note and vitals reviewed.   ED Course  Procedures (including critical care time) Labs Review Labs Reviewed  POCT RAPID STREP A (OFFICE)   Results for  orders placed or performed during the hospital encounter of 01/04/15  POCT rapid strep A  Result Value Ref Range   Rapid Strep A Screen Negative Negative     Imaging Review No results found.   MDM   1. Acute pharyngitis, unspecified pharyngitis type   2. Acute maxillary sinusitis, recurrence not specified   3. Acute bronchitis, unspecified organism    Treatment options discussed, as well as risks,  benefits, alternatives. Patient voiced understanding and agreement with the following plans: New Prescriptions   AMOXICILLIN (AMOXIL) 875 MG TABLET    Take 1 twice a day X 10 days.   Other symptomatic care discussed. Follow-up with your primary care doctor in 5-7 days if not improving, or sooner if symptoms become worse. Precautions discussed. Red flags discussed. Questions invited and answered. Patient voiced understanding and agreement.     Lajean Manesavid Massey, MD 01/04/15 [phone]

## 2015-01-04 NOTE — ED Notes (Signed)
Pt c/o productive cough and sore throat x 12/23/14. Denies fever. Taking mucinex.

## 2015-02-03 ENCOUNTER — Encounter: Payer: Self-pay | Admitting: Emergency Medicine

## 2015-02-03 ENCOUNTER — Emergency Department (INDEPENDENT_AMBULATORY_CARE_PROVIDER_SITE_OTHER)
Admission: EM | Admit: 2015-02-03 | Discharge: 2015-02-03 | Disposition: A | Discharge status: Home / Self Care | Payer: PRIVATE HEALTH INSURANCE | Source: Home / Self Care | Attending: Emergency Medicine | Admitting: Emergency Medicine

## 2015-02-03 DIAGNOSIS — N3001 Acute cystitis with hematuria: Secondary | ICD-10-CM

## 2015-02-03 DIAGNOSIS — R300 Dysuria: Secondary | ICD-10-CM

## 2015-02-03 LAB — POCT URINALYSIS DIP (MANUAL ENTRY)
Bilirubin, UA: NEGATIVE
Glucose, UA: NEGATIVE
Ketones, POC UA: NEGATIVE
Nitrite, UA: POSITIVE — AB
Protein Ur, POC: 100 — AB
Spec Grav, UA: 1.02 (ref 1.005–1.03)
Urobilinogen, UA: 0.2 (ref 0–1)
pH, UA: 6.5 (ref 5–8)

## 2015-02-03 MED ORDER — PHENAZOPYRIDINE HCL 200 MG PO TABS: ORAL_TABLET | ORAL | Status: DC

## 2015-02-03 MED ORDER — NITROFURANTOIN MONOHYD MACRO 100 MG PO CAPS: 100.0000 mg | ORAL_CAPSULE | Freq: Two times a day (BID) | ORAL | Status: DC

## 2015-02-03 NOTE — ED Notes (Signed)
Dysuria, Polyuria x 4 days, bladder pressure

## 2015-02-03 NOTE — ED Provider Notes (Signed)
CSN: [id number]     Arrival date & time 02/03/15  0806 History   None    Chief Complaint  Patient presents with  . Dysuria    HPI This is a 54 y.o. female who presents today with UTI symptoms for 4 days.  + dysuria + frequency + urgency No hematuria No vaginal discharge No fever/chills No lower abdominal pain No nausea No vomiting No back pain No fatigue She denies chance of pregnancy. Has tried over-the-counter measures without improvement.    Past Medical History  Diagnosis Date  . Endometriosis    Past Surgical History  Procedure Laterality Date  . Abdominal hysterectomy    . Laparoscopic endometriosis fulguration    . Dilation and curettage of uterus     Family History  Problem Relation Age of Onset  . Emphysema Father    History  Substance Use Topics  . Smoking status: Former Games developer  . Smokeless tobacco: Never Used  . Alcohol Use: 0.0 oz/week    0 Standard drinks or equivalent per week     Comment: rarely   OB History    Gravida Para Term Preterm AB TAB SAB Ectopic Multiple Living   Review of Systems Remainder of Review of Systems negative for acute change except as noted in the HPI.  Allergies  Codeine and Sulfonamide derivatives  Home Medications   Prior to Admission medications   Medication Sig Start Date End Date Taking? Authorizing Provider  nitrofurantoin, macrocrystal-monohydrate, (MACROBID) 100 MG capsule Take 1 capsule (100 mg total) by mouth 2 (two) times daily. For 7 days 02/03/15   Lajean Manes, MD  phenazopyridine (PYRIDIUM) 200 MG tablet Take 1 tablet by mouth every 6-8 hours if needed for urinary pain 02/03/15   Lajean Manes, MD   BP 118/71 mmHg  Pulse 72  Temp(Src) 98.3 F (36.8 C) (Oral)  Ht  (1.6 m)  Wt 198 lb (89.812 kg)  BMI 35.08 kg/m2  SpO2 72% Physical Exam  Constitutional: She is oriented to person, place, and time. She appears well-developed and well-nourished. No distress.  HENT:   Mouth/Throat: Oropharynx is clear and moist.  Eyes: No scleral icterus.  Neck: Neck supple.  Cardiovascular: Normal rate, regular rhythm and normal heart sounds.   Pulmonary/Chest: Breath sounds normal.  Abdominal: Soft. She exhibits no mass. There is no hepatosplenomegaly. There is tenderness in the suprapubic area. There is no rebound, no guarding and no CVA tenderness.  Lymphadenopathy:    She has no cervical adenopathy.  Neurological: She is alert and oriented to person, place, and time.  Skin: Skin is warm and dry.  Nursing note and vitals reviewed.   ED Course  Procedures (including critical care time) Labs Review Labs Reviewed  POCT URINALYSIS DIP (MANUAL ENTRY) - Abnormal; Notable for the following:    Clarity, UA cloudy (*)    Blood, UA large (*)    Protein Ur, POC =100 (*)    Nitrite, UA Positive (*)    Leukocytes, UA large (3+) (*)    All other components within normal limits  URINE CULTURE    Imaging Review No results found.   MDM   1. Acute cystitis with hematuria   2. Dysuria    Urine culture Macrobid 100 twice a day 7 days Pyridium 200 mg every 8 hours when necessary urinary pain for short-term relief Push fluids and other symptomatic care Follow-up with your primary  care doctor in 5-7 days if not improving, or sooner if symptoms become worse. Precautions discussed. Red flags discussed. Questions invited and answered. Patient voiced understanding and agreement.     Lajean Manes, MD 02/03/15 [phone]

## 2015-02-06 LAB — URINE CULTURE: Colony Count: >=100000

## 2015-02-06 MED ORDER — CEPHALEXIN 500 MG PO CAPS: 500.0000 mg | ORAL_CAPSULE | Freq: Three times a day (TID) | ORAL | Status: DC

## 2015-02-07 ENCOUNTER — Telehealth: Payer: Self-pay | Admitting: *Deleted

## 2015-08-03 ENCOUNTER — Emergency Department (INDEPENDENT_AMBULATORY_CARE_PROVIDER_SITE_OTHER)
Admission: EM | Admit: 2015-08-03 | Discharge: 2015-08-03 | Disposition: A | Discharge status: Home / Self Care | Payer: PRIVATE HEALTH INSURANCE | Source: Home / Self Care | Attending: Family Medicine | Admitting: Family Medicine

## 2015-08-03 ENCOUNTER — Encounter: Payer: Self-pay | Admitting: *Deleted

## 2015-08-03 DIAGNOSIS — N3090 Cystitis, unspecified without hematuria: Secondary | ICD-10-CM | POA: Diagnosis not present

## 2015-08-03 DIAGNOSIS — R300 Dysuria: Secondary | ICD-10-CM | POA: Diagnosis not present

## 2015-08-03 LAB — POCT URINALYSIS DIP (MANUAL ENTRY)
Bilirubin, UA: NEGATIVE
GLUCOSE UA: NEGATIVE
Ketones, POC UA: NEGATIVE
Nitrite, UA: POSITIVE — AB
PH UA: 5.5 (ref 5–8)
Protein Ur, POC: 30 — AB
UROBILINOGEN UA: 0.2 (ref 0–1)

## 2015-08-03 MED ORDER — CEPHALEXIN 500 MG PO CAPS: 500.0000 mg | ORAL_CAPSULE | Freq: Two times a day (BID) | ORAL | Status: DC

## 2015-08-03 NOTE — ED Notes (Signed)
Pt c/o 4 days of dysuria and polyuria. Afebrile, no hematuria.

## 2015-08-03 NOTE — ED Provider Notes (Signed)
CSN: [id number]     Arrival date & time 08/03/15  0827 History   First MD Initiated Contact with Patient 08/03/15 [phone]     Chief Complaint  Patient presents with  . Urinary Tract Infection      HPI Comments: Patient complains of 4 day history of dysuria and frequency.  She feels well otherwise.  No LMP recorded. Patient has had a hysterectomy.   Patient is a 53 y.o. female presenting with dysuria. The history is provided by the patient.  Dysuria Pain quality:  Burning Pain severity:  Mild Onset quality:  Gradual Duration:  4 days Timing:  Constant Progression:  Worsening Chronicity:  New Recent urinary tract infections: no   Relieved by:  None tried Worsened by:  Nothing tried Ineffective treatments:  None tried Urinary symptoms: frequent urination and hesitancy   Urinary symptoms: no discolored urine, no foul-smelling urine, no hematuria and no bladder incontinence   Associated symptoms: no abdominal pain, no fever, no flank pain, no genital lesions, no nausea, no vaginal discharge and no vomiting     Past Medical History  Diagnosis Date  . Endometriosis    Past Surgical History  Procedure Laterality Date  . Abdominal hysterectomy    . Laparoscopic endometriosis fulguration    . Dilation and curettage of uterus     Family History  Problem Relation Age of Onset  . Emphysema Father    Social History  Substance Use Topics  . Smoking status: Former Games developer  . Smokeless tobacco: Never Used  . Alcohol Use: 0.0 oz/week    0 Standard drinks or equivalent per week     Comment: rarely   OB History    Gravida Para Term Preterm AB TAB SAB Ectopic Multiple Living   Review of Systems  Constitutional: Negative for fever.  Gastrointestinal: Negative for nausea, vomiting and abdominal pain.  Genitourinary: Positive for dysuria. Negative for flank pain and vaginal discharge.  All other systems reviewed and are negative.   Allergies  Codeine and  Sulfonamide derivatives  Home Medications   Prior to Admission medications   Medication Sig Start Date End Date Taking? Authorizing Provider  cephALEXin (KEFLEX) 500 MG capsule Take 1 capsule (500 mg total) by mouth 2 (two) times daily. 08/03/15   Lattie Haw, MD   Meds Ordered and Administered this Visit  Medications - No data to display  BP 127/84 mmHg  Pulse 74  Temp(Src) 97.6 F (36.4 C) (Oral)  Resp 14  Wt 186 lb (84.369 kg)  SpO2 97% No data found.   Physical Exam Nursing notes and Vital Signs reviewed. Appearance:  Patient appears stated age, and in no acute distress Eyes:  Pupils are equal, round, and reactive to light and accomodation.  Extraocular movement is intact.  Conjunctivae are not inflamed  Nose:  Normal  Pharynx:  Normal; moist mucous membranes  Neck:  Supple.  No adenopathy Lungs:  Clear to auscultation.  Breath sounds are equal.  Moving air well. Heart:  Regular rate and rhythm without murmurs, rubs, or gallops.  Abdomen:  Nontender without masses or hepatosplenomegaly.  Bowel sounds are present.  No CVA or flank tenderness.  Extremities:  No edema.   Skin:  No rash present.   ED Course  Procedures  None    Labs Reviewed  POCT URINALYSIS DIP (MANUAL ENTRY) - Abnormal; Notable for the following:    Clarity, UA cloudy (*)  Blood, UA large (*)    Protein Ur, POC =30 (*)    Nitrite, UA Positive (*)    Leukocytes, UA Trace (*)    All other components within normal limits  URINE CULTURE      MDM   1. Dysuria   2. Cystitis    Urine culture pending. Begin Keflex 500mg  BID for one week. Increase fluid intake. If symptoms become significantly worse during the night or over the weekend, proceed to the local emergency room.  Followup with Family Doctor if not improved in one week.    Lattie HawStephen A Ron Junco, MD 08/03/15 [phone]

## 2015-08-03 NOTE — Discharge Instructions (Signed)
Increase fluid intake.  May use non-prescription AZO or Cystex for about two days, if desired, to decrease urinary discomfort.  If symptoms become significantly worse during the night or over the weekend, proceed to the local emergency room.    Urinary Tract Infection Urinary tract infections (UTIs) can develop anywhere along your urinary tract. Your urinary tract is your body's drainage system for removing wastes and extra water. Your urinary tract includes two kidneys, two ureters, a bladder, and a urethra. Your kidneys are a pair of bean-shaped organs. Each kidney is about the size of your fist. They are located below your ribs, one on each side of your spine. CAUSES Infections are caused by microbes, which are microscopic organisms, including fungi, viruses, and bacteria. These organisms are so small that they can only be seen through a microscope. Bacteria are the microbes that most commonly cause UTIs. SYMPTOMS  Symptoms of UTIs may vary by age and gender of the patient and by the location of the infection. Symptoms in young women typically include a frequent and intense urge to urinate and a painful, burning feeling in the bladder or urethra during urination. Older women and men are more likely to be tired, shaky, and weak and have muscle aches and abdominal pain. A fever may mean the infection is in your kidneys. Other symptoms of a kidney infection include pain in your back or sides below the ribs, nausea, and vomiting. DIAGNOSIS To diagnose a UTI, your caregiver will ask you about your symptoms. Your caregiver will also ask you to provide a urine sample. The urine sample will be tested for bacteria and white blood cells. White blood cells are made by your body to help fight infection. TREATMENT  Typically, UTIs can be treated with medication. Because most UTIs are caused by a bacterial infection, they usually can be treated with the use of antibiotics. The choice of antibiotic and length of  treatment depend on your symptoms and the type of bacteria causing your infection. HOME CARE INSTRUCTIONS  If you were prescribed antibiotics, take them exactly as your caregiver instructs you. Finish the medication even if you feel better after you have only taken some of the medication.  Drink enough water and fluids to keep your urine clear or pale yellow.  Avoid caffeine, tea, and carbonated beverages. They tend to irritate your bladder.  Empty your bladder often. Avoid holding urine for long periods of time.  Empty your bladder before and after sexual intercourse.  After a bowel movement, women should cleanse from front to back. Use each tissue only once. SEEK MEDICAL CARE IF:   You have back pain.  You develop a fever.  Your symptoms do not begin to resolve within 3 days. SEEK IMMEDIATE MEDICAL CARE IF:   You have severe back pain or lower abdominal pain.  You develop chills.  You have nausea or vomiting.  You have continued burning or discomfort with urination. MAKE SURE YOU:   Understand these instructions.  Will watch your condition.  Will get help right away if you are not doing well or get worse.   This information is not intended to replace advice given to you by your health care provider. Make sure you discuss any questions you have with your health care provider.   Document Released: 05/16/2005 Document Revised: 04/27/2015 Document Reviewed: 09/14/2011 Elsevier Interactive Patient Education [employer]2016 [employer] Inc.

## 2015-08-05 LAB — URINE CULTURE: Colony Count: >=100000

## 2015-08-07 ENCOUNTER — Telehealth: Payer: Self-pay | Admitting: *Deleted

## 2015-11-16 ENCOUNTER — Telehealth: Payer: Self-pay

## 2015-11-16 NOTE — Telephone Encounter (Signed)
Patient aware of results.

## 2016-02-07 ENCOUNTER — Emergency Department (INDEPENDENT_AMBULATORY_CARE_PROVIDER_SITE_OTHER)
Admission: EM | Admit: 2016-02-07 | Discharge: 2016-02-07 | Disposition: A | Discharge status: Home / Self Care | Payer: PRIVATE HEALTH INSURANCE | Source: Home / Self Care | Attending: Family Medicine | Admitting: Family Medicine

## 2016-02-07 ENCOUNTER — Encounter: Payer: Self-pay | Admitting: *Deleted

## 2016-02-07 DIAGNOSIS — F4321 Adjustment disorder with depressed mood: Secondary | ICD-10-CM

## 2016-02-07 MED ORDER — LORAZEPAM 0.5 MG PO TABS: ORAL_TABLET | ORAL | Status: AC

## 2016-02-07 NOTE — ED Notes (Signed)
Pt c/o feeling depressed and crying x 3-4 days. She denies any hx of taking antidepressants in the past.

## 2016-02-07 NOTE — ED Provider Notes (Signed)
CSN: [id number]     Arrival date & time 02/07/16  1508 History   First MD Initiated Contact with Patient 02/07/16 1533     Chief Complaint  Patient presents with  . Depression     HPI Comments: Patient reports that she has recently had an increasingly stressful relationship with her partner of 15 months, and finally broke up last night. She has been fatigued and depressed during the past 3 to 4 days with recurring episodes of crying. Today she had a difficult time concentrating and functioning at work.  She denies difficulty sleeping. No suicidal thoughts or ideation. She experienced the death of a family member about four years ago, and is again recalling memories of that difficult time.  She states that she has no past history of anxiety/depression, although she admits that she has lost about 25 pounds during the past year.  The history is provided by the patient.    Past Medical History  Diagnosis Date  . Endometriosis    Past Surgical History  Procedure Laterality Date  . Abdominal hysterectomy    . Laparoscopic endometriosis fulguration    . Dilation and curettage of uterus     Family History  Problem Relation Age of Onset  . Emphysema Father    Social History  Substance Use Topics  . Smoking status: Former Smoker    Quit date: 02/07/2011  . Smokeless tobacco: Never Used  . Alcohol Use: 0.0 oz/week    0 Standard drinks or equivalent per week     Comment: rarely   OB History    Gravida Para Term Preterm AB TAB SAB Ectopic Multiple Living   3 2 2  1 1    2      Review of Systems  Constitutional: Positive for activity change, appetite change and fatigue. Negative for fever, chills and diaphoresis.  HENT: Negative.   Eyes: Negative.   Respiratory: Negative.   Cardiovascular: Negative.   Gastrointestinal: Negative.   Endocrine: Negative.   Genitourinary: Negative.   Musculoskeletal: Negative.   Skin: Negative.   Neurological: Negative for headaches.   Psychiatric/Behavioral: Positive for decreased concentration. Negative for suicidal ideas, sleep disturbance and self-injury. The patient is nervous/anxious.     Allergies  Codeine and Sulfonamide derivatives  Home Medications   Prior to Admission medications   Medication Sig Start Date End Date Taking? Authorizing Provider  LORazepam (ATIVAN) 0.5 MG tablet Take one tab by mouth, one to three times daily as needed for anxiety. 02/07/16   Lattie HawStephen A Kirby Argueta, MD   Meds Ordered and Administered this Visit  Medications - No data to display  BP 139/91 mmHg  Pulse 77  Temp(Src) 98 F (36.7 C) (Oral)  Resp 16  Ht 5' 3.25" (1.607 m)  Wt 172 lb (78.019 kg)  BMI 30.21 kg/m2  SpO2 98% No data found.   Physical Exam Nursing notes and Vital Signs reviewed. Appearance:  Patient appears stated age, and in no acute distress. Psychiatric:  Patient is alert and oriented with good eye contact.  Thoughts are organized.  No psychomotor retardation.  Memory intact.  Not suicidal.  Affect is flat; mood is depressed.  Patient becomes tearful during interview. Eyes:  Pupils are equal, round, and reactive to light and accomodation.  Extraocular movement is intact.    Procedures none   MDM   1. Situational depression    Rx for lorazepam 0.5mg , one to three times daily prn #15, no refill. Arrange follow-up appt with [facility].    Lattie Haw, MD 02/07/16 [phone]

## 2016-05-13 ENCOUNTER — Emergency Department (INDEPENDENT_AMBULATORY_CARE_PROVIDER_SITE_OTHER)
Admission: EM | Admit: 2016-05-13 | Discharge: 2016-05-13 | Disposition: A | Discharge status: Home / Self Care | Payer: PRIVATE HEALTH INSURANCE | Source: Home / Self Care | Attending: Family Medicine | Admitting: Family Medicine

## 2016-05-13 ENCOUNTER — Encounter: Payer: Self-pay | Admitting: Emergency Medicine

## 2016-05-13 DIAGNOSIS — N390 Urinary tract infection, site not specified: Secondary | ICD-10-CM

## 2016-05-13 LAB — POCT URINALYSIS DIP (MANUAL ENTRY)
Bilirubin, UA: NEGATIVE
Glucose, UA: NEGATIVE
Ketones, POC UA: NEGATIVE
Nitrite, UA: POSITIVE — AB
Protein Ur, POC: 30 — AB
Spec Grav, UA: 1.02 (ref 1.005–1.03)
Urobilinogen, UA: 0.2 (ref 0–1)
pH, UA: 6.5 (ref 5–8)

## 2016-05-13 MED ORDER — CEPHALEXIN 500 MG PO CAPS: 500.0000 mg | ORAL_CAPSULE | Freq: Two times a day (BID) | ORAL | 0 refills | Status: DC

## 2016-05-13 NOTE — ED Provider Notes (Signed)
CSN: [id number]     Arrival date & time 05/13/16  1111 History   First MD Initiated Contact with Patient 05/13/16 1133     Chief Complaint  Patient presents with  . Dysuria  . Urinary Frequency   (Consider location/radiation/quality/duration/timing/severity/associated sxs/prior Treatment) HPI  RUBERTA HOLCK is a 55 y.o. female presenting to UC with c/o 3 days of urinary frequency and burning.  Pain is 2/10, gradually worsening.  Mild lower abdominal pressure.  She has not tried any Azo yet as she knows that can alter test results. Last UTI was in June 2017. Denies fever, chills, n/v/d.    Past Medical History:  Diagnosis Date  . Endometriosis    Past Surgical History:  Procedure Laterality Date  . ABDOMINAL HYSTERECTOMY    . DILATION AND CURETTAGE OF UTERUS    . LAPAROSCOPIC ENDOMETRIOSIS FULGURATION     Family History  Problem Relation Age of Onset  . Emphysema Father    Social History  Substance Use Topics  . Smoking status: Former Smoker    Quit date: 02/07/2011  . Smokeless tobacco: Never Used  . Alcohol use 0.0 oz/week     Comment: rarely   OB History    Gravida Para Term Preterm AB Living   3 2 2   1 2    SAB TAB Ectopic Multiple Live Births     1           Review of Systems  Gastrointestinal: Positive for abdominal pain (mild suprapubic pressure). Negative for diarrhea, nausea and vomiting.  Genitourinary: Positive for dysuria, frequency and urgency. Negative for decreased urine volume, flank pain, hematuria and pelvic pain.  Musculoskeletal: Negative for arthralgias, back pain and myalgias.    Allergies  Ciprocin-fluocin-procin [fluocinolone]; Codeine; and Sulfonamide derivatives  Home Medications   Prior to Admission medications   Medication Sig Start Date End Date Taking? Authorizing Provider  LORazepam (ATIVAN) 0.5 MG tablet Take one tab by mouth, one to three times daily as needed for anxiety. 02/07/16   Lattie Haw, MD   Meds Ordered and  Administered this Visit  Medications - No data to display  BP 116/73 (BP Location: Left Arm)   Pulse 75   Temp 98.1 F (36.7 C) (Oral)   Resp 16   Ht 5\' 3"  (1.6 m)   Wt 170 lb (77.1 kg)   SpO2 98%   BMI 30.11 kg/m  No data found.   Physical Exam  Constitutional: She is oriented to person, place, and time. She appears well-developed and well-nourished. No distress.  HENT:  Head: Normocephalic and atraumatic.  Mouth/Throat: Oropharynx is clear and moist.  Eyes: EOM are normal.  Neck: Normal range of motion.  Cardiovascular: Normal rate and regular rhythm.   Pulmonary/Chest: Effort normal and breath sounds normal. No respiratory distress. She has no wheezes. She has no rales.  Abdominal: Soft. She exhibits no distension and no mass. There is no tenderness. There is no rebound, no guarding and no CVA tenderness.  Musculoskeletal: Normal range of motion.  Neurological: She is alert and oriented to person, place, and time.  Skin: Skin is warm and dry. She is not diaphoretic.  Psychiatric: She has a normal mood and affect. Her behavior is normal.  Nursing note and vitals reviewed.   Urgent Care Course   Clinical Course    Procedures (including critical care time)  Labs Review Labs Reviewed  URINE CULTURE  POCT URINALYSIS DIP (MANUAL ENTRY)    Imaging Review No  results found.  MDM  No diagnosis found.  Pt c/o urinary symptoms c/w prior UTIs, last one was in June of 2017.  No systemic symptoms at this time.  UA: c/w UTI Will send culture.  Rx: Keflex Home care instructions provided. F/u with PCP in 4-5 days if not improving, sooner if worsening. Patient verbalized understanding and agreement with treatment plan.    Junius Finnerrin O'Malley, PA-C 05/13/16 1142

## 2016-05-17 LAB — URINE CULTURE

## 2016-05-18 ENCOUNTER — Telehealth: Payer: Self-pay

## 2016-05-18 NOTE — Telephone Encounter (Signed)
Attempted to call, LM on voice mail

## 2017-09-25 ENCOUNTER — Other Ambulatory Visit: Payer: Self-pay

## 2017-09-25 ENCOUNTER — Encounter: Payer: Self-pay | Admitting: *Deleted

## 2017-09-25 ENCOUNTER — Emergency Department (INDEPENDENT_AMBULATORY_CARE_PROVIDER_SITE_OTHER)
Admission: EM | Admit: 2017-09-25 | Discharge: 2017-09-25 | Disposition: A | Discharge status: Home / Self Care | Payer: PRIVATE HEALTH INSURANCE | Source: Home / Self Care | Attending: Family Medicine | Admitting: Family Medicine

## 2017-09-25 DIAGNOSIS — J111 Influenza due to unidentified influenza virus with other respiratory manifestations: Secondary | ICD-10-CM

## 2017-09-25 DIAGNOSIS — R69 Illness, unspecified: Secondary | ICD-10-CM | POA: Diagnosis not present

## 2017-09-25 MED ORDER — AZITHROMYCIN 250 MG PO TABS: ORAL_TABLET | ORAL | 0 refills | Status: AC

## 2017-09-25 NOTE — ED Triage Notes (Signed)
Pt c/o cough, body aches, chills and fatigue x 5 days. She has taken Advil, Nyquil and Dayquil without relief.

## 2017-09-25 NOTE — ED Provider Notes (Signed)
[facility]    CSN: [id number] Arrival date & time: 09/25/17  1456     History   Chief Complaint Chief Complaint  Patient presents with  . Cough  . Generalized Body Aches    HPI Christine Gilbert is a 57 y.o. female.   Complains of 5 day history flu-like illness including myalgias, chills, fatigue, and cough.  Also has mild nasal congestion but no sore throat.  Cough is non-productive and somewhat worse at night.  No pleuritic pain or shortness of breath.    The history is provided by the patient.    Past Medical History:  Diagnosis Date  . Endometriosis     Patient Active Problem List   Diagnosis Date Noted  . Dysuria 06/25/2011  . DERMATOPHYTOSIS OF OTHER SPECIFIED SITES 05/18/2009  . ANEMIA, HX OF 03/27/2009    Past Surgical History:  Procedure Laterality Date  . ABDOMINAL HYSTERECTOMY    . DILATION AND CURETTAGE OF UTERUS    . LAPAROSCOPIC ENDOMETRIOSIS FULGURATION      OB History    Gravida Para Term Preterm AB Living   3 2 2   1 2    SAB TAB Ectopic Multiple Live Births     1             Home Medications    Prior to Admission medications   Medication Sig Start Date End Date Taking? Authorizing Provider  azithromycin (ZITHROMAX Z-PAK) 250 MG tablet Take 2 tabs today; then begin one tab once daily for 4 more days. 09/25/17   Lattie HawBeese, Stephen A, MD  LORazepam (ATIVAN) 0.5 MG tablet Take one tab by mouth, one to three times daily as needed for anxiety. 02/07/16   Lattie HawBeese, Stephen A, MD    Family History Family History  Problem Relation Age of Onset  . Emphysema Father     Social History Social History   Tobacco Use  . Smoking status: Former Smoker    Last attempt to quit: 02/07/2011    Years since quitting: 6.6  . Smokeless tobacco: Never Used  Substance Use Topics  . Alcohol use: Yes    Alcohol/week: 0.0 oz    Comment: rarely  . Drug use: No     Allergies   Ciprocin-fluocin-procin [fluocinolone]; Codeine; and Sulfonamide  derivatives   Review of Systems Review of Systems No sore throat + cough No pleuritic pain No wheezing + nasal congestion + post-nasal drainage No sinus pain/pressure No itchy/red eyes No earache No hemoptysis No SOB No fever, + chills No nausea No vomiting No abdominal pain No diarrhea No urinary symptoms No skin rash + fatigue + myalgias No headache Used OTC meds without relief   Physical Exam Triage Vital Signs ED Triage Vitals [09/25/17 1536]  Enc Vitals Group     BP 128/86     Pulse Rate (!) 103     Resp 18     Temp 98.7 F (37.1 C)     Temp Source Oral     SpO2 96 %     Weight 159 lb (72.1 kg)     Height 5' 3.25" (1.607 m)     Head Circumference      Peak Flow      Pain Score 0     Pain Loc      Pain Edu?      Excl. in GC?    No data found.  Updated Vital Signs BP 128/86 (BP Location: Right Arm)   Pulse Marland Kitchen(!)  103   Temp 98.7 F (37.1 C) (Oral)   Resp 18   Ht 5' 3.25" (1.607 m)   Wt 159 lb (72.1 kg)   SpO2 96%   BMI 27.94 kg/m   Visual Acuity Right Eye Distance:   Left Eye Distance:   Bilateral Distance:    Right Eye Near:   Left Eye Near:    Bilateral Near:     Physical Exam Nursing notes and Vital Signs reviewed. Appearance:  Patient appears stated age, and in no acute distress Eyes:  Pupils are equal, round, and reactive to light and accomodation.  Extraocular movement is intact.  Conjunctivae are not inflamed  Ears:  Canals normal.  Tympanic membranes normal.  Nose:  Mildly congested turbinates.  No sinus tenderness. Pharynx:  Normal Neck:  Supple.  Enlarged posterior/lateral nodes are palpated bilaterally, tender to palpation on the left.   Lungs:  Scattered rhonchi.  Breath sounds are equal.  Moving air well. Heart:  Regular rate and rhythm without murmurs, rubs, or gallops.  Abdomen:  Nontender without masses or hepatosplenomegaly.  Bowel sounds are present.  No CVA or flank tenderness.  Extremities:  No edema.  Skin:  No  rash present.    UC Treatments / Results  Labs (all labs ordered are listed, but only abnormal results are displayed) Labs Reviewed - No data to display  EKG  EKG Interpretation None       Radiology No results found.  Procedures Procedures (including critical care time)  Medications Ordered in UC Medications - No data to display   Initial Impression / Assessment and Plan / UC Course  I have reviewed the triage vital signs and the nursing notes.  Pertinent labs & imaging results that were available during my care of the patient were reviewed by me and considered in my medical decision making (see chart for details).    Patient has missed window of opportunity for taking Tamiflu. Begin empiric Z-pak. Take plain guaifenesin (1200mg  extended release tabs such as Mucinex) twice daily, with plenty of water, for cough and congestion.  May add Pseudoephedrine (30mg , one or two every 4 to 6 hours) for sinus congestion.  Get adequate rest.   May use Afrin nasal spray (or generic oxymetazoline) each morning for about 5 days and then discontinue.  Also recommend using saline nasal spray several times daily and saline nasal irrigation (AYR is a common brand).  Use Flonase nasal spray each morning after using Afrin nasal spray and saline nasal irrigation. Try warm salt water gargles for sore throat.  Stop all antihistamines for now, and other non-prescription cough/cold preparations. May take Ibuprofen 200mg , 4 tabs every 8 hours with food for head aches, fever, etc. May take Delsym Cough Suppressant at bedtime for nighttime cough.  Followup with Family Doctor if not improved in 5 days.    Final Clinical Impressions(s) / UC Diagnoses   Final diagnoses:  Influenza-like illness    ED Discharge Orders        Ordered    azithromycin (ZITHROMAX Z-PAK) 250 MG tablet     09/25/17 1625           Lattie Haw, MD 09/28/17 [phone]

## 2017-09-25 NOTE — Discharge Instructions (Signed)
Take plain guaifenesin (1200mg  extended release tabs such as Mucinex) twice daily, with plenty of water, for cough and congestion.  May add Pseudoephedrine (30mg , one or two every 4 to 6 hours) for sinus congestion.  Get adequate rest.   May use Afrin nasal spray (or generic oxymetazoline) each morning for about 5 days and then discontinue.  Also recommend using saline nasal spray several times daily and saline nasal irrigation (AYR is a common brand).  Use Flonase nasal spray each morning after using Afrin nasal spray and saline nasal irrigation. Try warm salt water gargles for sore throat.  Stop all antihistamines for now, and other non-prescription cough/cold preparations. May take Ibuprofen 200mg , 4 tabs every 8 hours with food for head aches, fever, etc. May take Delsym Cough Suppressant at bedtime for nighttime cough.

## 2019-07-01 ENCOUNTER — Emergency Department
Admission: EM | Admit: 2019-07-01 | Discharge: 2019-07-01 | Disposition: A | Discharge status: Home / Self Care | Payer: No Typology Code available for payment source | Source: Home / Self Care | Attending: Family Medicine | Admitting: Family Medicine

## 2019-07-01 ENCOUNTER — Encounter: Payer: Self-pay | Admitting: Emergency Medicine

## 2019-07-01 ENCOUNTER — Other Ambulatory Visit: Payer: Self-pay

## 2019-07-01 DIAGNOSIS — J029 Acute pharyngitis, unspecified: Secondary | ICD-10-CM | POA: Diagnosis not present

## 2019-07-01 DIAGNOSIS — B9789 Other viral agents as the cause of diseases classified elsewhere: Secondary | ICD-10-CM | POA: Diagnosis not present

## 2019-07-01 DIAGNOSIS — J069 Acute upper respiratory infection, unspecified: Secondary | ICD-10-CM

## 2019-07-01 DIAGNOSIS — R05 Cough: Secondary | ICD-10-CM | POA: Diagnosis not present

## 2019-07-01 HISTORY — DX: COVID-19: U07.1

## 2019-07-01 LAB — POCT INFLUENZA A/B
Influenza A, POC: NEGATIVE
Influenza B, POC: NEGATIVE

## 2019-07-01 LAB — POC SARS CORONAVIRUS 2 AG -  ED: SARS Coronavirus 2 Ag: NEGATIVE

## 2019-07-01 NOTE — ED Provider Notes (Signed)
[facility]    CSN: [id number] Arrival date & time: 07/01/19  1239      History   Chief Complaint Chief Complaint  Patient presents with  . Nasal Congestion  . Cough    HPI Christine Gilbert is a 58 y.o. female.   Patient complains of five day history of typical cold-like symptoms developing over several days, including mild sore throat, sinus congestion, headache, fatigue, and cough.  She denies myalgias, fever, chest pain/tightness, and no changes in taste/smell. Patient had a positive COVID19 test on 06/03/19 experiencing only extreme fatigue at that time.  Her symptoms resolved completely.   The history is provided by the patient.    Past Medical History:  Diagnosis Date  . COVID-19    positive test 06/03/19  . Endometriosis     Patient Active Problem List   Diagnosis Date Noted  . Dysuria 06/25/2011  . DERMATOPHYTOSIS OF OTHER SPECIFIED SITES 05/18/2009  . ANEMIA, HX OF 03/27/2009    Past Surgical History:  Procedure Laterality Date  . ABDOMINAL HYSTERECTOMY    . DILATION AND CURETTAGE OF UTERUS    . LAPAROSCOPIC ENDOMETRIOSIS FULGURATION      OB History    Gravida  3   Para  2   Term  2   Preterm      AB  1   Living  2     SAB      TAB  1   Ectopic      Multiple      Live Births               Home Medications    Prior to Admission medications   Medication Sig Start Date End Date Taking? Authorizing Provider  azithromycin (ZITHROMAX Z-PAK) 250 MG tablet Take 2 tabs today; then begin one tab once daily for 4 more days. 09/25/17   Kandra Nicolas, MD  LORazepam (ATIVAN) 0.5 MG tablet Take one tab by mouth, one to three times daily as needed for anxiety. 02/07/16   Kandra Nicolas, MD    Family History Family History  Problem Relation Age of Onset  . Emphysema Father     Social History Social History   Tobacco Use  . Smoking status: Former Smoker    Quit date: 02/07/2011    Years since quitting: 8.4  .  Smokeless tobacco: Never Used  Substance Use Topics  . Alcohol use: Yes    Alcohol/week: 0.0 standard drinks    Comment: rarely  . Drug use: No     Allergies   Ciprocin-fluocin-procin [fluocinolone], Codeine, and Sulfonamide derivatives   Review of Systems Review of Systems + sore throat + cough No pleuritic pain No wheezing + nasal congestion + post-nasal drainage No sinus pain/pressure No itchy/red eyes No earache No hemoptysis No SOB No fever/chills No nausea No vomiting No abdominal pain No diarrhea No urinary symptoms No skin rash + fatigue No myalgias + headache Used OTC meds without relief   Physical Exam Triage Vital Signs ED Triage Vitals  Enc Vitals Group     BP 07/01/19 1400 (!) 139/95     Pulse Rate 07/01/19 1400 100     Resp 07/01/19 1400 18     Temp 07/01/19 1400 98.2 F (36.8 C)     Temp Source 07/01/19 1400 Oral     SpO2 07/01/19 1400 96 %     Weight 07/01/19 1404 173 lb (78.5 kg)     Height 07/01/19  1404 5\' 3"  (1.6 m)     Head Circumference --      Peak Flow --      Pain Score 07/01/19 1404 0     Pain Loc --      Pain Edu? --      Excl. in GC? --    No data found.  Updated Vital Signs BP (!) 139/95 (BP Location: Right Arm)   Pulse 100   Temp 98.2 F (36.8 C) (Oral)   Resp 18   Ht 5\' 3"  (1.6 m)   Wt 78.5 kg   SpO2 96%   BMI 30.65 kg/m   Visual Acuity Right Eye Distance:   Left Eye Distance:   Bilateral Distance:    Right Eye Near:   Left Eye Near:    Bilateral Near:     Physical Exam Nursing notes and Vital Signs reviewed. Appearance:  Patient appears stated age, and in no acute distress Eyes:  Pupils are equal, round, and reactive to light and accomodation.  Extraocular movement is intact.  Conjunctivae are not inflamed  Ears:  Canals normal.  Tympanic membranes normal.  Nose:  Mildly congested turbinates.  No sinus tenderness.   Pharynx:  Normal Neck:  Supple.  Enlarged posterior/lateral nodes are palpated  bilaterally, tender to palpation on the left.   Lungs:  Clear to auscultation.  Breath sounds are equal.  Moving air well. Heart:  Regular rate and rhythm without murmurs, rubs, or gallops.  Abdomen:  Nontender without masses or hepatosplenomegaly.  Bowel sounds are present.  No CVA or flank tenderness.  Extremities:  No edema.  Skin:  No rash present.    UC Treatments / Results  Labs (all labs ordered are listed, but only abnormal results are displayed) Labs Reviewed  POCT INFLUENZA A/B negative  POC SARS CORONAVIRUS 2 ED negative    EKG   Radiology No results found.  Procedures Procedures (including critical care time)  Medications Ordered in UC Medications - No data to display  Initial Impression / Assessment and Plan / UC Course  I have reviewed the triage vital signs and the nursing notes.  Pertinent labs & imaging results that were available during my care of the patient were reviewed by me and considered in my medical decision making (see chart for details).    There is no evidence of bacterial infection today.  Treat symptomatically for now. Followup with Family Doctor if not improved in about 10 days.   Final Clinical Impressions(s) / UC Diagnoses   Final diagnoses:  Viral URI with cough     Discharge Instructions     Take plain guaifenesin (1200mg  extended release tabs such as Mucinex) twice daily, with plenty of water, for cough and congestion.  May add Pseudoephedrine (30mg , one or two every 4 to 6 hours) for sinus congestion.  Get adequate rest.   May use Afrin nasal spray (or generic oxymetazoline) each morning for about 5 days and then discontinue.  Also recommend using saline nasal spray several times daily and saline nasal irrigation (AYR is a common brand).  Use Flonase nasal spray each morning after using Afrin nasal spray and saline nasal irrigation. Try warm salt water gargles for sore throat.  Stop all antihistamines for now, and other  non-prescription cough/cold preparations. May take Ibuprofen 200mg , 4 tabs every 8 hours with food for body aches, headache, etc. May take Delsym Cough Suppressant at bedtime for nighttime cough.   If symptoms become significantly worse during the  night or over the weekend, proceed to the local emergency room.              ED Prescriptions    None        Lattie HawBeese, Stephen A, MD 07/01/19 1624

## 2019-07-01 NOTE — ED Triage Notes (Addendum)
Patient here with progressing symptoms over past 4 days; no fever, some sneezing and congestion, light cough. She tested positive for COVID on 10/14 and only experienced extreme fatigue at that time.  No OTC today. Has not travelled outside [facility]. Has not had influenza vacc this season.

## 2019-07-01 NOTE — Discharge Instructions (Addendum)
Take plain guaifenesin (1200mg  extended release tabs such as Mucinex) twice daily, with plenty of water, for cough and congestion.  May add Pseudoephedrine (30mg , one or two every 4 to 6 hours) for sinus congestion.  Get adequate rest.   May use Afrin nasal spray (or generic oxymetazoline) each morning for about 5 days and then discontinue.  Also recommend using saline nasal spray several times daily and saline nasal irrigation (AYR is a common brand).  Use Flonase nasal spray each morning after using Afrin nasal spray and saline nasal irrigation. Try warm salt water gargles for sore throat.  Stop all antihistamines for now, and other non-prescription cough/cold preparations. May take Ibuprofen 200mg , 4 tabs every 8 hours with food for body aches, headache, etc. May take Delsym Cough Suppressant at bedtime for nighttime cough.   If symptoms become significantly worse during the night or over the weekend, proceed to the local emergency room.

## 2021-04-25 ENCOUNTER — Other Ambulatory Visit: Payer: Self-pay

## 2021-04-25 ENCOUNTER — Telehealth: Payer: Self-pay

## 2021-04-25 ENCOUNTER — Emergency Department
Admission: EM | Admit: 2021-04-25 | Discharge: 2021-04-25 | Disposition: A | Discharge status: Home / Self Care | Payer: No Typology Code available for payment source | Source: Home / Self Care

## 2021-04-25 DIAGNOSIS — J029 Acute pharyngitis, unspecified: Secondary | ICD-10-CM

## 2021-04-25 MED ORDER — CEFDINIR 300 MG PO CAPS: 300.0000 mg | ORAL_CAPSULE | Freq: Two times a day (BID) | ORAL | 0 refills | Status: AC

## 2021-04-25 MED ORDER — PREDNISONE 20 MG PO TABS: ORAL_TABLET | ORAL | 0 refills | Status: AC

## 2021-04-25 NOTE — Discharge Instructions (Addendum)
Advised/instructed patient to take medication as directed with food to completion.  Encouraged patient increase daily water intake while taking this medication. 

## 2021-04-25 NOTE — ED Provider Notes (Signed)
[facility]    CSN: [id number] Arrival date & time: 04/25/21  1848      History   Chief Complaint Chief Complaint  Patient presents with   Sore Throat    HPI Christine Gilbert is a 60 y.o. female.   HPI 60 year old female presents with sore throat x1 week.  Reports alert water gargles provide no relief.  Denies fever or COVID-19 exposure.  Patient is not vaccinated for COVID-19.  Past Medical History:  Diagnosis Date   COVID-19    positive test 06/03/19   Endometriosis     Patient Active Problem List   Diagnosis Date Noted   Dysuria 06/25/2011   DERMATOPHYTOSIS OF OTHER SPECIFIED SITES 05/18/2009   ANEMIA, HX OF 03/27/2009    Past Surgical History:  Procedure Laterality Date   ABDOMINAL HYSTERECTOMY     DILATION AND CURETTAGE OF UTERUS     LAPAROSCOPIC ENDOMETRIOSIS FULGURATION      OB History     Gravida  3   Para  2   Term  2   Preterm      AB  1   Living  2      SAB      IAB  1   Ectopic      Multiple      Live Births               Home Medications    Prior to Admission medications   Medication Sig Start Date End Date Taking? Authorizing Provider  cefdinir (OMNICEF) 300 MG capsule Take 1 capsule (300 mg total) by mouth 2 (two) times daily for 7 days. 04/25/21 05/02/21 Yes Trevor Iha, FNP  predniSONE (DELTASONE) 20 MG tablet Take 3 tabs PO x 5 days. 04/25/21  Yes Trevor Iha, FNP  azithromycin (ZITHROMAX Z-PAK) 250 MG tablet Take 2 tabs today; then begin one tab once daily for 4 more days. 09/25/17   Lattie Haw, MD  LORazepam (ATIVAN) 0.5 MG tablet Take one tab by mouth, one to three times daily as needed for anxiety. 02/07/16   Lattie Haw, MD    Family History Family History  Problem Relation Age of Onset   Emphysema Father     Social History Social History   Tobacco Use   Smoking status: Former    Types: Cigarettes    Quit date: 02/07/2011    Years since quitting: 10.2   Smokeless tobacco: Never   Vaping Use   Vaping Use: Never used  Substance Use Topics   Alcohol use: Yes    Alcohol/week: 0.0 standard drinks    Comment: rarely   Drug use: No     Allergies   Ciprocin-fluocin-procin [fluocinolone], Codeine, and Sulfonamide derivatives   Review of Systems Review of Systems  HENT:  Positive for sore throat.   All other systems reviewed and are negative.   Physical Exam Triage Vital Signs ED Triage Vitals  Enc Vitals Group     BP 04/25/21 1942 (!) 156/99     Pulse Rate 04/25/21 1942 77     Resp 04/25/21 1942 18     Temp 04/25/21 1942 98.3 F (36.8 C)     Temp Source 04/25/21 1942 Oral     SpO2 04/25/21 1942 95 %     Weight --      Height --      Head Circumference --      Peak Flow --      Pain Score 04/25/21  1925 5     Pain Loc --      Pain Edu? --      Excl. in GC? --    No data found.  Updated Vital Signs BP (!) 156/99 (BP Location: Left Arm)   Pulse 77   Temp 98.3 F (36.8 C) (Oral)   Resp 18   SpO2 95%      Physical Exam Vitals and nursing note reviewed.  Constitutional:      General: She is not in acute distress.    Appearance: She is well-developed. She is obese. She is not ill-appearing.  HENT:     Head: Normocephalic and atraumatic.     Right Ear: Tympanic membrane and ear canal normal.     Left Ear: Tympanic membrane and ear canal normal.     Mouth/Throat:     Mouth: Mucous membranes are moist.     Pharynx: Oropharynx is clear. Uvula midline. Posterior oropharyngeal erythema and uvula swelling present.  Eyes:     Conjunctiva/sclera: Conjunctivae normal.     Pupils: Pupils are equal, round, and reactive to light.  Cardiovascular:     Rate and Rhythm: Normal rate and regular rhythm.     Heart sounds: Normal heart sounds. No murmur heard. Pulmonary:     Effort: Pulmonary effort is normal.     Breath sounds: Normal breath sounds.     Comments: No adventitious breath sounds noted Musculoskeletal:     Cervical back: Normal range of  motion and neck supple.  Lymphadenopathy:     Cervical: Cervical adenopathy present.  Skin:    General: Skin is warm and dry.  Neurological:     General: No focal deficit present.     Mental Status: She is alert and oriented to person, place, and time.  Psychiatric:        Mood and Affect: Mood normal.        Behavior: Behavior normal.     UC Treatments / Results  Labs (all labs ordered are listed, but only abnormal results are displayed) Labs Reviewed - No data to display  EKG   Radiology No results found.  Procedures Procedures (including critical care time)  Medications Ordered in UC Medications - No data to display  Initial Impression / Assessment and Plan / UC Course  I have reviewed the triage vital signs and the nursing notes.  Pertinent labs & imaging results that were available during my care of the patient were reviewed by me and considered in my medical decision making (see chart for details).     MDM: 1.  Acute Pharyngitis-Rx'd Cefdinir Advised/instructed patient to take medication as directed with food to completion.  Encouraged patient increase daily water intake while taking these medications.  Discharged home, hemodynamically stable. Final Clinical Impressions(s) / UC Diagnoses   Final diagnoses:  Acute pharyngitis, unspecified etiology     Discharge Instructions      Advised/instructed patient to take medication as directed with food to completion.  Encouraged patient increase daily water intake while taking this medication.     ED Prescriptions     Medication Sig Dispense Auth. Provider   cefdinir (OMNICEF) 300 MG capsule Take 1 capsule (300 mg total) by mouth 2 (two) times daily for 7 days. 14 capsule Trevor Iha, FNP   predniSONE (DELTASONE) 20 MG tablet Take 3 tabs PO x 5 days. 15 tablet Trevor Iha, FNP      PDMP not reviewed this encounter.   Trevor Iha, FNP 04/25/21  2041  

## 2021-04-25 NOTE — ED Triage Notes (Signed)
Pt c/o sore throat x 1 week. Salt water gargles with no relief. Denies fever. No known covid exposure. Not vaccinated. Pain 5/10

## 2021-04-25 NOTE — Telephone Encounter (Signed)
Call made to pharmacy to cancel prednisone rx. Sent in error per Trevor Iha, NP

## 2021-11-28 DIAGNOSIS — J029 Acute pharyngitis, unspecified: Secondary | ICD-10-CM | POA: Diagnosis not present

## 2022-12-17 DIAGNOSIS — L608 Other nail disorders: Secondary | ICD-10-CM | POA: Diagnosis not present

## 2022-12-17 DIAGNOSIS — Z1329 Encounter for screening for other suspected endocrine disorder: Secondary | ICD-10-CM | POA: Diagnosis not present

## 2022-12-17 DIAGNOSIS — Z1231 Encounter for screening mammogram for malignant neoplasm of breast: Secondary | ICD-10-CM | POA: Diagnosis not present

## 2022-12-17 DIAGNOSIS — Z131 Encounter for screening for diabetes mellitus: Secondary | ICD-10-CM | POA: Diagnosis not present

## 2022-12-17 DIAGNOSIS — Z0000 Encounter for general adult medical examination without abnormal findings: Secondary | ICD-10-CM | POA: Diagnosis not present

## 2022-12-17 DIAGNOSIS — Z13220 Encounter for screening for lipoid disorders: Secondary | ICD-10-CM | POA: Diagnosis not present

## 2022-12-17 DIAGNOSIS — R21 Rash and other nonspecific skin eruption: Secondary | ICD-10-CM | POA: Diagnosis not present

## 2022-12-17 DIAGNOSIS — Z1330 Encounter for screening examination for mental health and behavioral disorders, unspecified: Secondary | ICD-10-CM | POA: Diagnosis not present

## 2022-12-17 DIAGNOSIS — E559 Vitamin D deficiency, unspecified: Secondary | ICD-10-CM | POA: Diagnosis not present

## 2023-01-01 DIAGNOSIS — R92313 Mammographic fatty tissue density, bilateral breasts: Secondary | ICD-10-CM | POA: Diagnosis not present

## 2023-01-01 DIAGNOSIS — Z1231 Encounter for screening mammogram for malignant neoplasm of breast: Secondary | ICD-10-CM | POA: Diagnosis not present
